# Patient Record
Sex: Female | Born: 1960 | Race: White | Hispanic: No | Smoking: Former smoker
Health system: Southern US, Community
[De-identification: ages and names within clinical notes are randomized; demographics above are authoritative.]

## PROBLEM LIST (undated history)

## (undated) DIAGNOSIS — I10 Essential (primary) hypertension: Secondary | ICD-10-CM

## (undated) DIAGNOSIS — F329 Major depressive disorder, single episode, unspecified: Secondary | ICD-10-CM

## (undated) DIAGNOSIS — J329 Chronic sinusitis, unspecified: Secondary | ICD-10-CM

## (undated) DIAGNOSIS — E785 Hyperlipidemia, unspecified: Secondary | ICD-10-CM

## (undated) DIAGNOSIS — F32A Depression, unspecified: Secondary | ICD-10-CM

## (undated) DIAGNOSIS — C801 Malignant (primary) neoplasm, unspecified: Secondary | ICD-10-CM

## (undated) DIAGNOSIS — R7303 Prediabetes: Secondary | ICD-10-CM

## (undated) DIAGNOSIS — H269 Unspecified cataract: Secondary | ICD-10-CM

## (undated) HISTORY — PX: ABDOMINAL HYSTERECTOMY: SHX81

## (undated) HISTORY — PX: EYE SURGERY: SHX253

## (undated) HISTORY — DX: Essential (primary) hypertension: I10

## (undated) HISTORY — DX: Chronic sinusitis, unspecified: J32.9

## (undated) HISTORY — DX: Hyperlipidemia, unspecified: E78.5

## (undated) HISTORY — DX: Unspecified cataract: H26.9

## (undated) HISTORY — DX: Malignant (primary) neoplasm, unspecified: C80.1

## (undated) HISTORY — DX: Prediabetes: R73.03

## (undated) HISTORY — DX: Major depressive disorder, single episode, unspecified: F32.9

## (undated) HISTORY — DX: Depression, unspecified: F32.A

---

## 2014-01-19 LAB — HM MAMMOGRAPHY

## 2015-03-08 LAB — HM MAMMOGRAPHY

## 2015-04-25 LAB — HM PAP SMEAR

## 2016-04-10 LAB — HM MAMMOGRAPHY

## 2016-08-31 LAB — HM HEPATITIS C SCREENING LAB: HM Hepatitis Screen: NEGATIVE

## 2016-08-31 LAB — TSH: TSH: 0.79 (ref 0.41–5.90)

## 2016-11-27 LAB — HM HIV SCREENING LAB: HM HIV Screening: NEGATIVE

## 2017-08-06 ENCOUNTER — Ambulatory Visit (INDEPENDENT_AMBULATORY_CARE_PROVIDER_SITE_OTHER): Payer: PRIVATE HEALTH INSURANCE | Admitting: Family Medicine

## 2017-08-06 ENCOUNTER — Encounter: Payer: Self-pay | Admitting: Family Medicine

## 2017-08-06 VITALS — BP 124/80 | HR 63 | Temp 98.4°F | Resp 16 | Ht 64.0 in | Wt 201.0 lb

## 2017-08-06 DIAGNOSIS — Z85828 Personal history of other malignant neoplasm of skin: Secondary | ICD-10-CM | POA: Diagnosis not present

## 2017-08-06 DIAGNOSIS — L309 Dermatitis, unspecified: Secondary | ICD-10-CM | POA: Diagnosis not present

## 2017-08-06 DIAGNOSIS — H9190 Unspecified hearing loss, unspecified ear: Secondary | ICD-10-CM | POA: Insufficient documentation

## 2017-08-06 DIAGNOSIS — E785 Hyperlipidemia, unspecified: Secondary | ICD-10-CM

## 2017-08-06 DIAGNOSIS — H9193 Unspecified hearing loss, bilateral: Secondary | ICD-10-CM | POA: Diagnosis not present

## 2017-08-06 DIAGNOSIS — J329 Chronic sinusitis, unspecified: Secondary | ICD-10-CM | POA: Diagnosis not present

## 2017-08-06 DIAGNOSIS — M7741 Metatarsalgia, right foot: Secondary | ICD-10-CM | POA: Diagnosis not present

## 2017-08-06 DIAGNOSIS — R7303 Prediabetes: Secondary | ICD-10-CM

## 2017-08-06 DIAGNOSIS — F32A Depression, unspecified: Secondary | ICD-10-CM | POA: Insufficient documentation

## 2017-08-06 DIAGNOSIS — F339 Major depressive disorder, recurrent, unspecified: Secondary | ICD-10-CM

## 2017-08-06 DIAGNOSIS — H259 Unspecified age-related cataract: Secondary | ICD-10-CM

## 2017-08-06 DIAGNOSIS — H269 Unspecified cataract: Secondary | ICD-10-CM | POA: Insufficient documentation

## 2017-08-06 DIAGNOSIS — I10 Essential (primary) hypertension: Secondary | ICD-10-CM

## 2017-08-06 DIAGNOSIS — F329 Major depressive disorder, single episode, unspecified: Secondary | ICD-10-CM | POA: Insufficient documentation

## 2017-08-06 MED ORDER — TRIAMCINOLONE ACETONIDE 0.5 % EX OINT: 1.0000 "application " | TOPICAL_OINTMENT | Freq: Two times a day (BID) | CUTANEOUS | 0 refills | Status: DC

## 2017-08-06 NOTE — Assessment & Plan Note (Signed)
Right foot with intermittent numbness of middle 3 toes as well as metatarsal pain Patient has lost transverse arch and does have flat feet bilaterally Discussed proper foot wear Discussed getting insoles for her shoes with metatarsal pads as well as arch support Return precautions discussed

## 2017-08-06 NOTE — Assessment & Plan Note (Signed)
Worsening problem per patient Referral to ophthalmology for further evaluation and management

## 2017-08-06 NOTE — Assessment & Plan Note (Signed)
Chronic problem that has been helped with hearing aids Hearing aids are currently broken Will refer to audiology for further evaluation and management

## 2017-08-06 NOTE — Assessment & Plan Note (Signed)
Chronic and fairly well-controlled Refilled triamcinolone ointment

## 2017-08-06 NOTE — Assessment & Plan Note (Signed)
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

## 2017-08-06 NOTE — Assessment & Plan Note (Signed)
No abnormal lesions currently per patient report Full skin exam was not completed today Referral to dermatology for monitoring, annual skin checks, any procedures that may be indicated in the future

## 2017-08-06 NOTE — Assessment & Plan Note (Signed)
Control unknown Continue Lipitor at current dose [facility] records from previous PCP Repeat lipid panel and CMP today

## 2017-08-06 NOTE — Progress Notes (Signed)
Patient: Janice Powers, Female    DOB: 1961-05-18, 57 y.o.   MRN: [id number] Visit Date: 08/06/2017  Today's Provider: Lavon Paganini, MD   I, Martha Clan, CMA, am acting as scribe for Lavon Paganini, MD.  Chief Complaint  Patient presents with  . Establish Care   Subjective:    Establish Care Janice Powers is a 57 y.o. female who presents today for health maintenance and to establish care. She feels well. She reports exercising 5 days per week for 30 minutes. Walks in the mornings. Currently not exercising, as she needs to "get back into routine" after moving to the area. She reports she is sleeping well.  Pt just moved to the area from [place], [place]. She states her colonoscopy and mammogram are UTD. She received flu vaccine in September.  Hearing aids have recently died.  She requests referral to Audiology  Recurrent sinusitis: Previously seeing ENT and would like to referral to local ENT.  No sinus surgeries.  Last infection >1 yr ago.  Cataracts: Previously seeing Optho.  States they are just starting to form and she is noticing decreased night vision.  S/p lasik eye surgery - Things seemed to revert with age  Non-melanoma skin cancer: Has had several local excisions of non-melanomas.  Was previously seeign Derm for annual skin check.  Father has also had several non-melanoma skin cancers  Previously seeing GYN for pap smears and mammograms. S/p supracervical abdominal hysterectomy.  No h/o abnormal pap smears.  HTN: Taking HCTZ 12.5 mg daily.  Reports good compliance and denies side efffects  HLD: Taking lipitor 10 mg daily.  Thinks it has been >1 yr since last lipid panel drawn.  Reports good compliance and denies side effects.  No previous CAD/CVA.  Prediabetes: Has never crossed into full-blown diabetes.  Has never taken medications.  R foot pain: Pain in middle 3 toes of R foot with tingling/numbness intermittently.  Hasn't noticed any pattern related to shoe  wear or activities that seems to worsen this.  Hasn't noticed anything that makes it better or worse.   Depression: currently well controlled on Celexa 30 mg daily. She has a therapist who helps her with coping mechanisms. 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 She is concerned that this may be very stressful and way on her mental health. She states it is not currently causing any issues. She does have good stress reduction techniques and coping techniques from her therapist. She does meditate and exercise regularly to help with her stress levels.  -----------------------------------------------------------------   Review of Systems  Constitutional: Negative.   HENT: Positive for hearing loss and sinus pressure. Negative for congestion, dental problem, drooling, ear discharge, ear pain, facial swelling, mouth sores, nosebleeds, postnasal drip, rhinorrhea, sinus pain, sneezing, sore throat, tinnitus, trouble swallowing and voice change.   Eyes: Negative.   Respiratory: Negative.   Cardiovascular: Negative.   Gastrointestinal: Negative.   Endocrine: Negative.   Genitourinary: Negative.   Musculoskeletal: Positive for back pain. Negative for arthralgias, gait problem, joint swelling, myalgias, neck pain and neck stiffness.  Skin: Negative.   Allergic/Immunologic: Negative.   Neurological: Positive for numbness. Negative for dizziness, tremors, seizures, syncope, facial asymmetry, speech difficulty, weakness, light-headedness and headaches.  Hematological: Negative.   Psychiatric/Behavioral: Negative.     Social History      She  reports that she quit smoking about 35 years ago. Her smoking use included cigarettes. She  has a 0.50 pack-year smoking history. she has never used smokeless tobacco. She reports that she drinks about 2.4 oz of alcohol per week. She reports that she does not use drugs.       Social History   Socioeconomic History  .  Marital status: Divorced    Spouse name: None  . Number of children: 0  . Years of education: 16  . Highest education level: Bachelor's degree (e.g., BA, AB, BS)  Social Needs  . Financial resource strain: Not hard at all  . Food insecurity - worry: Never true  . Food insecurity - inability: Never true  . Transportation needs - medical: No  . Transportation needs - non-medical: No  Occupational History    Employer: [employer]  Tobacco Use  . Smoking status: Former Smoker    Packs/day: 0.50    Years: 1.00    Pack years: 0.50    Types: Cigarettes    Last attempt to quit: 07/12/1982    Years since quitting: 35.0  . Smokeless tobacco: Never Used  Substance and Sexual Activity  . Alcohol use: Yes    Alcohol/week: 2.4 oz    Types: 2 Shots of liquor, 2 Glasses of wine per week  . Drug use: No  . Sexual activity: No  Other Topics Concern  . None  Social History Narrative  . None    Past Medical History:  Diagnosis Date  . Cancer ([place])    skin  . Cataract   . Depression   . Hyperlipidemia   . Hypertension   . Prediabetes   . Recurrent sinusitis      There are no active problems to display for this patient.   Past Surgical History:  Procedure Laterality Date  . ABDOMINAL HYSTERECTOMY     supracervical  . EYE SURGERY     lasik    Family History        Family Status  Relation Name Status  . Mother  Deceased  . Father  Alive  . Brother  Deceased  . Neg Hx  (Not Specified)        Her family history includes COPD in her father; Congenital heart disease in her brother; Diabetes in her father; Multiple myeloma in her mother.     Allergies  Allergen Reactions  . Neomycin Rash  . Penicillins Rash  . Sulfa Antibiotics Rash     Current Outpatient Medications:  .  atorvastatin (LIPITOR) 10 MG tablet, , Disp: , Rfl:  .  BIOTIN PO, Take by mouth., Disp: , Rfl:  .  CALCIUM PO, Take by mouth., Disp: , Rfl:  .  citalopram (CELEXA) 10 MG tablet, Take 10 mg by  mouth daily., Disp: , Rfl:  .  citalopram (CELEXA) 20 MG tablet, , Disp: , Rfl:  .  fluticasone (FLONASE) 50 MCG/ACT nasal spray, , Disp: , Rfl:  .  hydrochlorothiazide (HYDRODIURIL) 12.5 MG tablet, , Disp: , Rfl:  .  Multiple Vitamins-Minerals (CENTRUM SILVER PO), Take by mouth., Disp: , Rfl:  .  naproxen sodium (ALEVE) 220 MG tablet, Take 220 mg by mouth., Disp: , Rfl:  .  Nutritional Supplements (ESTROVEN PO), Take by mouth., Disp: , Rfl:  .  Omega-3 Fatty Acids (FISH OIL PO), Take by mouth., Disp: , Rfl:  .  Potassium 99 MG TABS, Take by mouth., Disp: , Rfl:  .  psyllium (METAMUCIL) 58.6 % powder, Take 1 packet by mouth 3 (three) times daily., Disp: , Rfl:  Patient Care Team: Virginia Crews, MD as PCP - General (Family Medicine)      Objective:   Vitals: BP 124/80 (BP Location: Left Arm, Patient Position: Sitting, Cuff Size: Large)   Pulse 63   Temp 98.4 F (36.9 C) (Oral)   Resp 16   Ht 5' 4"  (1.626 m)   Wt 201 lb (91.2 kg)   SpO2 97%   BMI 34.50 kg/m    Vitals:   08/06/17 1514  BP: 124/80  Pulse: 63  Resp: 16  Temp: 98.4 F (36.9 C)  TempSrc: Oral  SpO2: 97%  Weight: 201 lb (91.2 kg)  Height: 5' 4"  (1.626 m)     Physical Exam  Constitutional: She is oriented to person, place, and time. She appears well-developed and well-nourished. No distress.  HENT:  Head: Normocephalic and atraumatic.  Right Ear: External ear normal.  Left Ear: External ear normal.  Nose: Nose normal.  Mouth/Throat: Oropharynx is clear and moist.  Eyes: Conjunctivae and EOM are normal. Pupils are equal, round, and reactive to light. No scleral icterus.  Neck: Neck supple. No thyromegaly present.  Cardiovascular: Normal rate, regular rhythm, normal heart sounds and intact distal pulses.  No murmur heard. Pulmonary/Chest: Breath sounds normal. No respiratory distress. She has no wheezes. She has no rales.  Abdominal: Soft. Bowel sounds are normal. She exhibits no distension. There  is no tenderness. There is no rebound and no guarding.  Musculoskeletal: She exhibits no edema or tenderness.  B/l feet: sensation intact with monofilament testing. Low longitudinal arches. Wide front toe box. Slightly everted feet bilaterally. Loss of transverse arch bilaterally worse in right foot and left foot.  No deformity or tenderness to palpation. No ulcerations or wounds.  Lymphadenopathy:    She has no cervical adenopathy.  Neurological: She is alert and oriented to person, place, and time.  Skin: Skin is warm and dry. No rash noted.  Psychiatric: She has a normal mood and affect. Her behavior is normal.  Vitals reviewed.    Depression Screen PHQ 2/9 Scores 08/06/2017  PHQ - 2 Score 0     Assessment & Plan:    Problem List Items Addressed This Visit      Cardiovascular and Mediastinum   Hypertension - Primary    Currently well controlled Continue HCTZ at current dose Check CMP today Follow-up in 6 months      Relevant Medications   atorvastatin (LIPITOR) 10 MG tablet   hydrochlorothiazide (HYDRODIURIL) 12.5 MG tablet   Other Relevant Orders   Comprehensive metabolic panel     Respiratory   Recurrent sinusitis    Chronic intermittent problem No issues currently or any evidence of acute bacterial sinusitis As patient was previously followed by ENT and she desires rereferral, I will refer to ENT today for management      Relevant Medications   fluticasone (FLONASE) 50 MCG/ACT nasal spray   Other Relevant Orders   Ambulatory referral to ENT   CBC w/Diff/Platelet     Nervous and Auditory   Hearing loss    Chronic problem that has been helped with hearing aids Hearing aids are currently broken Will refer to audiology for further evaluation and management      Relevant Orders   Ambulatory referral to Audiology     Musculoskeletal and Integument   Eczema    Chronic and fairly well-controlled Refilled triamcinolone ointment        Other    Hyperlipidemia    Control unknown Continue  Lipitor at current dose [facility] records from previous PCP Repeat lipid panel and CMP today      Relevant Medications   atorvastatin (LIPITOR) 10 MG tablet   hydrochlorothiazide (HYDRODIURIL) 12.5 MG tablet   Other Relevant Orders   Lipid Profile   Comprehensive metabolic panel   Prediabetes    Last A1c unknown We'll request records from previous PCP Continue with low-carb diet and regular exercise Recheck A1c today Follow-up in 6 months      Relevant Orders   Hemoglobin A1c   Cataract    Worsening problem per patient Referral to ophthalmology for further evaluation and management      Relevant Orders   Ambulatory referral to Ophthalmology   Depression    Chronic and currently well controlled Continue Celexa 30 mg daily We will monitor closely 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 Return precautions discussed May need to consider dose titration of Celexa or addition of a second medication such as Wellbutrin in the future      Relevant Medications   citalopram (CELEXA) 20 MG tablet   Other Relevant Orders   CBC w/Diff/Platelet   History of nonmelanoma skin cancer    No abnormal lesions currently per patient report Full skin exam was not completed today Referral to dermatology for monitoring, annual skin checks, any procedures that may be indicated in the future      Relevant Orders   Ambulatory referral to Dermatology   Metatarsalgia of right foot    Right foot with intermittent numbness of middle 3 toes as well as metatarsal pain Patient has lost transverse arch and does have flat feet bilaterally Discussed proper foot wear Discussed getting insoles for her shoes with metatarsal pads as well as arch support Return precautions discussed          Return in about 6 months (around 02/03/2018).   The entirety of the information documented in the History of Present Illness, Review of Systems and  Physical Exam were personally obtained by me. Portions of this information were initially documented by Raquel Sarna Ratchford, CMA and reviewed by me for thoroughness and accuracy.    Virginia Crews, MD, MPH [facility] 08/06/2017 4:45 PM

## 2017-08-06 NOTE — Patient Instructions (Addendum)
Look into insoles with arch support and metatarsal pads   Morton Neuralgia Morton neuralgia is a type of foot pain in the area closest to your toes. This area is sometimes called the ball of your foot. Morton neuralgia occurs when a branch of a nerve in your foot (digital nerve) becomes compressed. When this happens over a long period of time, the nerve can thicken (neuroma) and cause pain. This usually occurs between the third and fourth toe. Morton neuralgia can come and go but may get worse over time. What are the causes? Your digital nerve can become compressed and stretched at a point where it passes under a thick band of tissue that connects your toes (intermetatarsal ligament). Morton neuralgia can be caused by mild repetitive damage in this area. This type of damage can result from:  Activities such as running or jumping.  Wearing shoes that are too tight.  What increases the risk? You may be at risk for Morton neuralgia if you:  Are female.  Wear high heels.  Wear shoes that are narrow or tight.  Participate in activities that stretch your toes. These include: ? Running. ? [place]. ? Long-distance walking.  What are the signs or symptoms? The first symptom of Morton neuralgia is pain that spreads from the ball of your foot to your toes. It may feel like you are walking on a marble. Pain usually gets worse with walking and goes away at night. Other symptoms may include numbness and cramping of your toes. How is this diagnosed? Your health care provider will do a physical exam. When doing the exam, your health care provider may:  Squeeze your foot just behind your toe.  Ask you to move your toes to check for pain.  You may also have tests on your foot to confirm the diagnosis. These may include:  An X-ray.  An MRI.  How is this treated? Treatment for Morton neuralgia may be as simple as changing the kind of shoes you wear. Other treatments may include:  Wearing a  supportive pad (orthosis) under the front of your foot. This lifts your toe bones and takes pressure off the nerve.  Getting injections of numbing medicine and anti-inflammatory medicine (steroid) in the nerve.  Having surgery to remove part of the thickened nerve.  Follow these instructions at home:  Take medicine only as directed by your health care provider.  Wear soft-soled shoes with a wide toe area.  Stop activities that may be causing pain.  Elevate your foot when resting.  Massage your foot.  Apply ice to the injured area: ? Put ice in a plastic bag. ? Place a towel between your skin and the bag. ? Leave the ice on for 20 minutes, 2-3 times a day.  Keep all follow-up visits as directed by your health care provider. This is important. Contact a health care provider if:  Home care instructions are not helping you get better.  Your symptoms change or get worse. This information is not intended to replace advice given to you by your health care provider. Make sure you discuss any questions you have with your health care provider. Document Released: 10/05/2000 Document Revised: 12/05/2015 Document Reviewed: 08/30/2013 Elsevier Interactive Patient Education  [employer].

## 2017-08-06 NOTE — Assessment & Plan Note (Signed)
Chronic intermittent problem No issues currently or any evidence of acute bacterial sinusitis As patient was previously followed by ENT and she desires rereferral, I will refer to ENT today for management

## 2017-08-06 NOTE — Assessment & Plan Note (Signed)
Last A1c unknown We'll request records from previous PCP Continue with low-carb diet and regular exercise Recheck A1c today Follow-up in 6 months

## 2017-08-06 NOTE — Assessment & Plan Note (Signed)
Currently well controlled Continue HCTZ at current dose Check CMP today Follow-up in 6 months

## 2017-08-07 ENCOUNTER — Encounter: Payer: Self-pay | Admitting: Family Medicine

## 2017-08-07 LAB — CBC WITH DIFFERENTIAL/PLATELET
BASOS: 0 %
Basophils Absolute: 0 10*3/uL (ref 0.0–0.2)
EOS (ABSOLUTE): 0.2 10*3/uL (ref 0.0–0.4)
Eos: 2 %
Hematocrit: 35.6 % (ref 34.0–46.6)
Hemoglobin: 11.6 g/dL (ref 11.1–15.9)
Immature Grans (Abs): 0 10*3/uL (ref 0.0–0.1)
Immature Granulocytes: 0 %
LYMPHS ABS: 3.1 10*3/uL (ref 0.7–3.1)
Lymphs: 25 %
MCH: 29.3 pg (ref 26.6–33.0)
MCHC: 32.6 g/dL (ref 31.5–35.7)
MCV: 90 fL (ref 79–97)
Monocytes Absolute: 0.7 10*3/uL (ref 0.1–0.9)
Monocytes: 6 %
NEUTROS ABS: 8.2 10*3/uL — AB (ref 1.4–7.0)
Neutrophils: 67 %
PLATELETS: 255 10*3/uL (ref 150–379)
RBC: 3.96 x10E6/uL (ref 3.77–5.28)
RDW: 13.1 % (ref 12.3–15.4)
WBC: 12.3 10*3/uL — ABNORMAL HIGH (ref 3.4–10.8)

## 2017-08-07 LAB — COMPREHENSIVE METABOLIC PANEL
A/G RATIO: 1.6 (ref 1.2–2.2)
ALK PHOS: 91 IU/L (ref 39–117)
ALT: 18 IU/L (ref 0–32)
AST: 14 IU/L (ref 0–40)
Albumin: 4.4 g/dL (ref 3.5–5.5)
BILIRUBIN TOTAL: 0.3 mg/dL (ref 0.0–1.2)
BUN / CREAT RATIO: 20 (ref 9–23)
BUN: 14 mg/dL (ref 6–24)
CO2: 26 mmol/L (ref 20–29)
Calcium: 10.2 mg/dL (ref 8.7–10.2)
Chloride: 101 mmol/L (ref 96–106)
Creatinine, Ser: 0.7 mg/dL (ref 0.57–1.00)
GFR calc Af Amer: 113 mL/min/{1.73_m2} (ref >59)
GFR calc non Af Amer: 98 mL/min/{1.73_m2} (ref >59)
GLOBULIN, TOTAL: 2.8 g/dL (ref 1.5–4.5)
Glucose: 94 mg/dL (ref 65–99)
POTASSIUM: 3.7 mmol/L (ref 3.5–5.2)
SODIUM: 141 mmol/L (ref 134–144)
Total Protein: 7.2 g/dL (ref 6.0–8.5)

## 2017-08-07 LAB — LIPID PANEL
CHOLESTEROL TOTAL: 173 mg/dL (ref 100–199)
Chol/HDL Ratio: 3.4 ratio (ref 0.0–4.4)
HDL: 51 mg/dL (ref >39)
LDL Calculated: 102 mg/dL — ABNORMAL HIGH (ref 0–99)
TRIGLYCERIDES: 98 mg/dL (ref 0–149)
VLDL CHOLESTEROL CAL: 20 mg/dL (ref 5–40)

## 2017-08-07 LAB — HEMOGLOBIN A1C
ESTIMATED AVERAGE GLUCOSE: 123 mg/dL
HEMOGLOBIN A1C: 5.9 % — AB (ref 4.8–5.6)

## 2017-08-09 ENCOUNTER — Telehealth: Payer: Self-pay

## 2017-08-09 MED ORDER — ATORVASTATIN CALCIUM 20 MG PO TABS: 20.0000 mg | ORAL_TABLET | Freq: Every day | ORAL | 1 refills | Status: DC

## 2017-08-09 NOTE — Telephone Encounter (Signed)
FYI.Marland KitchenMarland KitchenFixed her preferred pharmacy in the chart.   Thanks,   -Mickel Baas

## 2017-08-09 NOTE — Telephone Encounter (Signed)
Pt advised.  RX sent to [facility] in [place].   Thanks,   -Mickel Baas

## 2017-08-09 NOTE — Telephone Encounter (Signed)
-----   Message from Virginia Crews, MD sent at 08/09/2017  9:28 AM EST ----- Cholesterol is fair, but LDL (bad cholesterol) is just slightly above goal of <100.  Would recommend increasing Atorvastatin to 20 mg daily.  If patient agrees, can send in new Rx.  Normal kidney function, liver function, electrolytes.  A1c still in prediabetic range at 5.9.  Diet and exercise as discussed.  WBC count is slightly elevated.  There are a lot of things that could cause this, including any recent illness. We will plan to recheck at next visit.  Virginia Crews, MD, MPH [facility] 08/09/2017 9:28 AM

## 2017-09-01 ENCOUNTER — Other Ambulatory Visit: Payer: Self-pay | Admitting: Family Medicine

## 2017-09-02 ENCOUNTER — Encounter: Payer: Self-pay | Admitting: Family Medicine

## 2017-09-02 MED ORDER — TRIAMCINOLONE ACETONIDE 0.5 % EX OINT: 1.0000 "application " | TOPICAL_OINTMENT | Freq: Two times a day (BID) | CUTANEOUS | 3 refills | Status: DC

## 2017-09-06 ENCOUNTER — Encounter: Payer: Self-pay | Admitting: Family Medicine

## 2017-09-06 DIAGNOSIS — Z85828 Personal history of other malignant neoplasm of skin: Secondary | ICD-10-CM

## 2017-09-08 ENCOUNTER — Encounter: Payer: Self-pay | Admitting: Family Medicine

## 2017-09-09 MED ORDER — CITALOPRAM HYDROBROMIDE 20 MG PO TABS: 30.0000 mg | ORAL_TABLET | Freq: Every day | ORAL | 5 refills | Status: DC

## 2017-09-09 MED ORDER — HYDROCHLOROTHIAZIDE 12.5 MG PO TABS: 12.5000 mg | ORAL_TABLET | Freq: Every day | ORAL | 5 refills | Status: DC

## 2017-10-14 ENCOUNTER — Encounter: Payer: Self-pay | Admitting: Family Medicine

## 2017-10-14 MED ORDER — CITALOPRAM HYDROBROMIDE 20 MG PO TABS: 30.0000 mg | ORAL_TABLET | Freq: Every day | ORAL | 3 refills | Status: DC

## 2018-02-04 ENCOUNTER — Encounter: Payer: Self-pay | Admitting: Family Medicine

## 2018-02-04 ENCOUNTER — Ambulatory Visit (INDEPENDENT_AMBULATORY_CARE_PROVIDER_SITE_OTHER): Payer: PRIVATE HEALTH INSURANCE | Admitting: Family Medicine

## 2018-02-04 VITALS — BP 136/84 | HR 83 | Temp 98.3°F | Resp 16 | Wt 185.0 lb

## 2018-02-04 DIAGNOSIS — E7800 Pure hypercholesterolemia, unspecified: Secondary | ICD-10-CM | POA: Diagnosis not present

## 2018-02-04 DIAGNOSIS — S83411A Sprain of medial collateral ligament of right knee, initial encounter: Secondary | ICD-10-CM

## 2018-02-04 DIAGNOSIS — R7303 Prediabetes: Secondary | ICD-10-CM

## 2018-02-04 DIAGNOSIS — D72829 Elevated white blood cell count, unspecified: Secondary | ICD-10-CM | POA: Insufficient documentation

## 2018-02-04 DIAGNOSIS — I10 Essential (primary) hypertension: Secondary | ICD-10-CM

## 2018-02-04 DIAGNOSIS — E669 Obesity, unspecified: Secondary | ICD-10-CM | POA: Insufficient documentation

## 2018-02-04 DIAGNOSIS — Z6831 Body mass index (BMI) 31.0-31.9, adult: Secondary | ICD-10-CM

## 2018-02-04 LAB — POCT GLYCOSYLATED HEMOGLOBIN (HGB A1C)
ESTIMATED AVERAGE GLUCOSE: 117
Hemoglobin A1C: 5.7 % — AB (ref 4.0–5.6)

## 2018-02-04 NOTE — Assessment & Plan Note (Signed)
Well-controlled Continue HCTZ at current dose Check CMP Follow-up in 6 months

## 2018-02-04 NOTE — Assessment & Plan Note (Signed)
LDL was above goal at last check Lipitor was increased to 20 mg daily at that time Recheck CMP and lipid panel on new dose of Lipitor

## 2018-02-04 NOTE — Assessment & Plan Note (Signed)
A1c improving Continue with low-carb diet and regular exercise Follow-up in 6 months

## 2018-02-04 NOTE — Assessment & Plan Note (Signed)
Congratulated patient on her 16 pound weight loss Continue with diet program through her work that is designed to decrease her heart disease and diabetes risk

## 2018-02-04 NOTE — Assessment & Plan Note (Signed)
WBC elevated at last check Likely was reactive and not very elevated Recheck CBC today

## 2018-02-04 NOTE — Patient Instructions (Addendum)
Congratulations on the weight loss  No changes to medications  Come back to get labs one morning when fasting

## 2018-02-04 NOTE — Progress Notes (Signed)
Patient: Janice Powers Female    DOB: June 21, 1961   57 y.o.   MRN: [id number] Visit Date: 02/04/2018  Today's Provider: Lavon Paganini, MD   I, Martha Clan, CMA, am acting as scribe for Lavon Paganini, MD.  Chief Complaint  Patient presents with  . Hyperglycemia  . Hypertension  . Hyperlipidemia  . Abnormal Lab   Subjective:    HPI      Prediabetes, Follow-up:   Lab Results  Component Value Date   HGBA1C 5.7 (A) 02/04/2018   HGBA1C 5.9 (H) 08/06/2017   Last seen for diabetes 6 months ago.  Management since then includes encouraging pt on work on diet and exercise. She reports excellent compliance with treatment. She is not having side effects.  Current symptoms include none and have been stable. Weight trend: decreasing steadily through program at work that has a goal of decreasing DM and heart disease risk. Current diet: 3 proteins, 3 fruits, 3 vegetables, and 3 dairies daily. Gets carbs thorugh snacking (1/3 cup trail mix, "lunchbox snacks", granola bars, [employer] biscuits, etc Current exercise: walks to the walking path near her house about 5 days per week, which takes about 25 minutes  ------------------------------------------------------------------------   Hypertension, follow-up:  BP Readings from Last 3 Encounters:  02/04/18 136/84  08/06/17 124/80    She was last seen for hypertension 6 months ago.  BP at that visit was 124/80. Management since that visit includes continuing HCTZ.She reports good compliance with treatment. She is not having side effects.  She is adherent to low salt diet.   Outside blood pressures are improving per pt. She is experiencing none.  Patient denies chest pain, chest pressure/discomfort, claudication, dyspnea, exertional chest pressure/discomfort, fatigue, irregular heart beat, lower extremity edema, near-syncope, orthopnea, palpitations and syncope.   Cardiovascular risk factors include dyslipidemia and  hypertension.  Use of agents associated with hypertension: NSAIDS.   ------------------------------------------------------------------------    Lipid/Cholesterol, Follow-up:   Last seen for this 6 months ago.  Management since that visit includes increasing atorvastatin to 20 mg.  Last Lipid Panel:    Component Value Date/Time   CHOL 173 08/06/2017 1628   TRIG 98 08/06/2017 1628   HDL 51 08/06/2017 1628   CHOLHDL 3.4 08/06/2017 1628   LDLCALC 102 (H) 08/06/2017 1628    She reports excellent compliance with treatment. She is not having side effects.   Wt Readings from Last 3 Encounters:  02/04/18 185 lb (83.9 kg)  08/06/17 201 lb (91.2 kg)    ------------------------------------------------------------------------ Pt's last WBC count was elevated. PCP Wanted to recheck this at follow up [place].  Lab Results  Component Value Date   WBC 12.3 (H) 08/06/2017   HGB 11.6 08/06/2017   HCT 35.6 08/06/2017   MCV 90 08/06/2017   PLT 255 08/06/2017   Pt would also like to discuss a possible right medial knee strain that occurred after a fall that she had in April. She slipped on wet pavement.  Immediate pain, mild swelling.  No pop felt. Hasn't seen anyone for this problem.  She states she iced and rested the knee and use Naproxen daily prn for this for about 1.5 weeks, and the pain has resolved. She wants to make sure this is alright now.   Allergies  Allergen Reactions  . Neomycin Rash  . Penicillins Rash  . Sulfa Antibiotics Rash     Current Outpatient Medications:  .  atorvastatin (LIPITOR) 20 MG tablet, Take 1 tablet (20 mg total)  by mouth daily., Disp: 90 tablet, Rfl: 1 .  BIOTIN PO, Take by mouth., Disp: , Rfl:  .  CALCIUM PO, Take by mouth., Disp: , Rfl:  .  citalopram (CELEXA) 20 MG tablet, Take 1.5 tablets (30 mg total) by mouth daily., Disp: 135 tablet, Rfl: 3 .  fluticasone (FLONASE) 50 MCG/ACT nasal spray, , Disp: , Rfl:  .  hydrochlorothiazide (HYDRODIURIL)  12.5 MG tablet, Take 1 tablet (12.5 mg total) by mouth daily., Disp: 30 tablet, Rfl: 5 .  Multiple Vitamins-Minerals (CENTRUM SILVER PO), Take by mouth., Disp: , Rfl:  .  naproxen sodium (ALEVE) 220 MG tablet, Take 220 mg by mouth., Disp: , Rfl:  .  Nutritional Supplements (ESTROVEN PO), Take by mouth., Disp: , Rfl:  .  Omega-3 Fatty Acids (FISH OIL PO), Take by mouth., Disp: , Rfl:  .  Potassium 99 MG TABS, Take by mouth., Disp: , Rfl:  .  triamcinolone ointment (KENALOG) 0.5 %, Apply 1 application topically 2 (two) times daily., Disp: 30 g, Rfl: 3 .  psyllium (METAMUCIL) 58.6 % powder, Take 1 packet by mouth 3 (three) times daily., Disp: , Rfl:   Review of Systems  Constitutional: Negative for activity change, appetite change, chills, diaphoresis, fatigue, fever and unexpected weight change.  Respiratory: Negative for shortness of breath.   Cardiovascular: Negative for chest pain, palpitations and leg swelling.  Endocrine: Negative for polydipsia, polyphagia and polyuria.  Musculoskeletal: Positive for arthralgias.    Social History   Tobacco Use  . Smoking status: Former Smoker    Packs/day: 0.50    Years: 1.00    Pack years: 0.50    Types: Cigarettes    Last attempt to quit: 07/12/1982    Years since quitting: 35.5  . Smokeless tobacco: Never Used  Substance Use Topics  . Alcohol use: Yes    Alcohol/week: 2.4 oz    Types: 2 Shots of liquor, 2 Glasses of wine per week   Objective:   BP 136/84 (BP Location: Left Arm, Patient Position: Sitting, Cuff Size: Large)   Pulse 83   Temp 98.3 F (36.8 C) (Oral)   Resp 16   Wt 185 lb (83.9 kg)   SpO2 96%   BMI 31.76 kg/m  Vitals:   02/04/18 1044  BP: 136/84  Pulse: 83  Resp: 16  Temp: 98.3 F (36.8 C)  TempSrc: Oral  SpO2: 96%  Weight: 185 lb (83.9 kg)     Physical Exam  Constitutional: She is oriented to person, place, and time. She appears well-developed and well-nourished. No distress.  HENT:  Head: Normocephalic  and atraumatic.  Mouth/Throat: Oropharynx is clear and moist.  Eyes: Pupils are equal, round, and reactive to light. Conjunctivae are normal. Right eye exhibits no discharge. Left eye exhibits no discharge. No scleral icterus.  Neck: Neck supple. No thyromegaly present.  Cardiovascular: Normal rate, regular rhythm, normal heart sounds and intact distal pulses.  No murmur heard. Pulmonary/Chest: Effort normal and breath sounds normal. No respiratory distress. She has no wheezes. She has no rales.  Musculoskeletal: She exhibits no edema or deformity.  R Knee:  Normal to inspection with no erythema or effusion or obvious bony abnormalities.  Palpation normal with no warmth or joint line tenderness or patellar tenderness or condyle tenderness. ROM normal in flexion and extension and lower leg rotation. Ligaments with solid consistent endpoints including ACL, PCL, LCL, MCL. Negative Mcmurray's and provocative meniscal tests. Non painful patellar compression. Patellar and quadriceps tendons unremarkable. Hamstring and  quadriceps strength is normal.  Lymphadenopathy:    She has no cervical adenopathy.  Neurological: She is alert and oriented to person, place, and time.  Skin: Skin is warm and dry. Capillary refill takes less than 2 seconds. No rash noted.  Psychiatric: She has a normal mood and affect. Her behavior is normal.  Vitals reviewed.      Assessment & Plan:   Problem List Items Addressed This Visit      Cardiovascular and Mediastinum   Hypertension    Well-controlled Continue HCTZ at current dose Check CMP Follow-up in 6 months      Relevant Orders   Comprehensive metabolic panel     Other   Hyperlipidemia    LDL was above goal at last check Lipitor was increased to 20 mg daily at that time Recheck CMP and lipid panel on new dose of Lipitor      Relevant Orders   Lipid panel   Comprehensive metabolic panel   Prediabetes - Primary    A1c improving Continue with  low-carb diet and regular exercise Follow-up in 6 months      Relevant Orders   POCT glycosylated hemoglobin (Hb A1C) (Completed)   Leukocytosis    WBC elevated at last check Likely was reactive and not very elevated Recheck CBC today      Relevant Orders   CBC   Obesity    Congratulated patient on her 16 pound weight loss Continue with diet program through her work that is designed to decrease her heart disease and diabetes risk       Other Visit Diagnoses    Sprain of medial collateral ligament of right knee, initial encounter        -Normal knee exam today without any swelling, tenderness, laxity of ligaments - I believe that she did have a sprain of her MCL her fall that has now resolved -Discussed with patient that she did the right things with rest, ice, NSAIDs -No need for imaging or other work-up at this time -Return precautions discussed   Return in about 6 months (around 08/07/2018) for CPE.  We did not receive records from previous PCP after last visit, so we will send another ROI.  Patient believes that she is up-to-date on mammogram, Pap smear, hep C screening, colon cancer screening, vaccinations.   The entirety of the information documented in the History of Present Illness, Review of Systems and Physical Exam were personally obtained by me. Portions of this information were initially documented by Raquel Sarna Ratchford, CMA and reviewed by me for thoroughness and accuracy.    Virginia Crews, MD, MPH [facility] 02/04/2018 11:26 AM

## 2018-02-14 ENCOUNTER — Other Ambulatory Visit: Payer: Self-pay | Admitting: Family Medicine

## 2018-03-01 ENCOUNTER — Encounter: Payer: Self-pay | Admitting: Family Medicine

## 2018-03-05 LAB — CBC
HEMOGLOBIN: 12.3 g/dL (ref 11.1–15.9)
Hematocrit: 37.3 % (ref 34.0–46.6)
MCH: 29.8 pg (ref 26.6–33.0)
MCHC: 33 g/dL (ref 31.5–35.7)
MCV: 90 fL (ref 79–97)
Platelets: 249 10*3/uL (ref 150–450)
RBC: 4.13 x10E6/uL (ref 3.77–5.28)
RDW: 12.9 % (ref 12.3–15.4)
WBC: 8.8 10*3/uL (ref 3.4–10.8)

## 2018-03-05 LAB — COMPREHENSIVE METABOLIC PANEL
ALT: 19 IU/L (ref 0–32)
AST: 17 IU/L (ref 0–40)
Albumin/Globulin Ratio: 1.7 (ref 1.2–2.2)
Albumin: 4.6 g/dL (ref 3.5–5.5)
Alkaline Phosphatase: 90 IU/L (ref 39–117)
BUN/Creatinine Ratio: 23 (ref 9–23)
BUN: 18 mg/dL (ref 6–24)
Bilirubin Total: 0.4 mg/dL (ref 0.0–1.2)
CALCIUM: 9.7 mg/dL (ref 8.7–10.2)
CO2: 24 mmol/L (ref 20–29)
CREATININE: 0.79 mg/dL (ref 0.57–1.00)
Chloride: 102 mmol/L (ref 96–106)
GFR, EST AFRICAN AMERICAN: 97 mL/min/{1.73_m2} (ref >59)
GFR, EST NON AFRICAN AMERICAN: 84 mL/min/{1.73_m2} (ref >59)
GLOBULIN, TOTAL: 2.7 g/dL (ref 1.5–4.5)
Glucose: 86 mg/dL (ref 65–99)
Potassium: 3.8 mmol/L (ref 3.5–5.2)
Sodium: 142 mmol/L (ref 134–144)
TOTAL PROTEIN: 7.3 g/dL (ref 6.0–8.5)

## 2018-03-05 LAB — LIPID PANEL
CHOLESTEROL TOTAL: 164 mg/dL (ref 100–199)
Chol/HDL Ratio: 3.1 ratio (ref 0.0–4.4)
HDL: 53 mg/dL (ref >39)
LDL CALC: 86 mg/dL (ref 0–99)
Triglycerides: 124 mg/dL (ref 0–149)
VLDL Cholesterol Cal: 25 mg/dL (ref 5–40)

## 2018-03-15 ENCOUNTER — Other Ambulatory Visit: Payer: Self-pay | Admitting: Family Medicine

## 2018-04-02 ENCOUNTER — Ambulatory Visit (INDEPENDENT_AMBULATORY_CARE_PROVIDER_SITE_OTHER): Payer: PRIVATE HEALTH INSURANCE

## 2018-04-02 DIAGNOSIS — Z23 Encounter for immunization: Secondary | ICD-10-CM | POA: Diagnosis not present

## 2018-06-13 ENCOUNTER — Other Ambulatory Visit: Payer: Self-pay | Admitting: Family Medicine

## 2018-07-16 ENCOUNTER — Encounter: Payer: Self-pay | Admitting: Family Medicine

## 2018-07-18 MED ORDER — FLUTICASONE PROPIONATE 50 MCG/ACT NA SUSP: 2.0000 | Freq: Every day | NASAL | 3 refills | Status: DC

## 2018-08-06 ENCOUNTER — Encounter: Payer: Self-pay | Admitting: Family Medicine

## 2018-08-12 ENCOUNTER — Ambulatory Visit (INDEPENDENT_AMBULATORY_CARE_PROVIDER_SITE_OTHER): Payer: PRIVATE HEALTH INSURANCE | Admitting: Family Medicine

## 2018-08-12 ENCOUNTER — Encounter: Payer: Self-pay | Admitting: Family Medicine

## 2018-08-12 VITALS — BP 133/83 | HR 65 | Temp 98.3°F | Ht 64.0 in | Wt 186.8 lb

## 2018-08-12 DIAGNOSIS — Z1239 Encounter for other screening for malignant neoplasm of breast: Secondary | ICD-10-CM

## 2018-08-12 DIAGNOSIS — Z0000 Encounter for general adult medical examination without abnormal findings: Secondary | ICD-10-CM

## 2018-08-12 NOTE — Patient Instructions (Addendum)
Would like records from last colonoscopy, pap smear, mammogram, labs, vaccines   Preventive Care 57-64 Years, Female Preventive care refers to lifestyle choices and visits with your health care provider that can promote health and wellness. What does preventive care include?   A yearly physical exam. This is also called an annual well check.  Dental exams once or twice a year.  Routine eye exams. Ask your health care provider how often you should have your eyes checked.  Personal lifestyle choices, including: ? Daily care of your teeth and gums. ? Regular physical activity. ? Eating a healthy diet. ? Avoiding tobacco and drug use. ? Limiting alcohol use. ? Practicing safe sex. ? Taking low-dose aspirin daily starting at age 40. ? Taking vitamin and mineral supplements as recommended by your health care provider. What happens during an annual well check? The services and screenings done by your health care provider during your annual well check will depend on your age, overall health, lifestyle risk factors, and family history of disease. Counseling Your health care provider may ask you questions about your:  Alcohol use.  Tobacco use.  Drug use.  Emotional well-being.  Home and relationship well-being.  Sexual activity.  Eating habits.  Work and work Statistician.  Method of birth control.  Menstrual cycle.  Pregnancy history. Screening You may have the following tests or measurements:  Height, weight, and BMI.  Blood pressure.  Lipid and cholesterol levels. These may be checked every 5 years, or more frequently if you are over 57 years old.  Skin check.  Lung cancer screening. You may have this screening every year starting at age 57 if you have a 30-pack-year history of smoking and currently smoke or have quit within the past 15 years.  Colorectal cancer screening. All adults should have this screening starting at age 30 and continuing until age 57. Your  health care provider may recommend screening at age 25. You will have tests every 1-10 years, depending on your results and the type of screening test. People at increased risk should start screening at an earlier age. Screening tests may include: ? Guaiac-based fecal occult blood testing. ? Fecal immunochemical test (FIT). ? Stool DNA test. ? Virtual colonoscopy. ? Sigmoidoscopy. During this test, a flexible tube with a tiny camera (sigmoidoscope) is used to examine your rectum and lower colon. The sigmoidoscope is inserted through your anus into your rectum and lower colon. ? Colonoscopy. During this test, a long, thin, flexible tube with a tiny camera (colonoscope) is used to examine your entire colon and rectum.  Hepatitis C blood test.  Hepatitis B blood test.  Sexually transmitted disease (STD) testing.  Diabetes screening. This is done by checking your blood sugar (glucose) after you have not eaten for a while (fasting). You may have this done every 1-3 years.  Mammogram. This may be done every 1-2 years. Talk to your health care provider about when you should start having regular mammograms. This may depend on whether you have a family history of breast cancer.  BRCA-related cancer screening. This may be done if you have a family history of breast, ovarian, tubal, or peritoneal cancers.  Pelvic exam and Pap test. This may be done every 3 years starting at age 57. Starting at age 27, this may be done every 5 years if you have a Pap test in combination with an HPV test.  Bone density scan. This is done to screen for osteoporosis. You may have this scan if  you are at high risk for osteoporosis. Discuss your test results, treatment options, and if necessary, the need for more tests with your health care provider. Vaccines Your health care provider may recommend certain vaccines, such as:  Influenza vaccine. This is recommended every year.  Tetanus, diphtheria, and acellular pertussis  (Tdap, Td) vaccine. You may need a Td booster every 10 years.  Varicella vaccine. You may need this if you have not been vaccinated.  Zoster vaccine. You may need this after age 17.  Measles, mumps, and rubella (MMR) vaccine. You may need at least one dose of MMR if you were born in 1957 or later. You may also need a second dose.  Pneumococcal 13-valent conjugate (PCV13) vaccine. You may need this if you have certain conditions and were not previously vaccinated.  Pneumococcal polysaccharide (PPSV23) vaccine. You may need one or two doses if you smoke cigarettes or if you have certain conditions.  Meningococcal vaccine. You may need this if you have certain conditions.  Hepatitis A vaccine. You may need this if you have certain conditions or if you travel or work in places where you may be exposed to hepatitis A.  Hepatitis B vaccine. You may need this if you have certain conditions or if you travel or work in places where you may be exposed to hepatitis B.  Haemophilus influenzae type b (Hib) vaccine. You may need this if you have certain conditions. Talk to your health care provider about which screenings and vaccines you need and how often you need them. This information is not intended to replace advice given to you by your health care provider. Make sure you discuss any questions you have with your health care provider. Document Released: 07/26/2015 Document Revised: 08/19/2017 Document Reviewed: 04/30/2015 Elsevier Interactive Patient Education  2019 [employer].

## 2018-08-12 NOTE — Progress Notes (Signed)
Patient: Janice Powers, Female    DOB: [birth year], 58 y.o.   MRN: [id number] Visit Date: 08/12/2018  Today's Provider: Lavon Paganini, MD   Chief Complaint  Patient presents with  . Annual Exam   Subjective:  I, Tiburcio Pea, CMA, am acting as a scribe for Lavon Paganini, MD.    Annual physical exam Taila Basinski is a 58 y.o. female who presents today for health maintenance and complete physical. She feels fairly well. She reports exercising 5 days per week. She reports she is sleeping fairly well.  Trouble concentration Thinks that it related to stress  Sinus pressure for months No discolored mucus Tried flonase claritin helps as well and aleve Humidifier in the house -----------------------------------------------------------------   Review of Systems  Constitutional: Negative.   HENT: Positive for sinus pressure.   Eyes: Positive for itching.  Respiratory: Negative.   Cardiovascular: Negative.   Gastrointestinal: Negative.   Endocrine: Negative.   Genitourinary: Negative.   Musculoskeletal: Positive for arthralgias.  Skin: Negative.   Allergic/Immunologic: Negative.   Neurological: Negative.   Hematological: Negative.   Psychiatric/Behavioral: Positive for decreased concentration.    Social History      She  reports that she quit smoking about 36 years ago. Her smoking use included cigarettes. She has a 0.50 pack-year smoking history. She has never used smokeless tobacco. She reports current alcohol use of about 4.0 standard drinks of alcohol per week. She reports that she does not use drugs.       Social History   Socioeconomic History  . Marital status: Divorced    Spouse name: Not on file  . Number of children: 0  . Years of education: 16  . Highest education level: Bachelor's degree (e.g., BA, AB, BS)  Occupational History  . Occupation: Nurse, adult: [employer]  Social Needs  . Financial resource strain: Not hard at all  . Food  insecurity:    Worry: Never true    Inability: Never true  . Transportation needs:    Medical: No    Non-medical: No  Tobacco Use  . Smoking status: Former Smoker    Packs/day: 0.50    Years: 1.00    Pack years: 0.50    Types: Cigarettes    Last attempt to quit: 07/12/1982    Years since quitting: 36.1  . Smokeless tobacco: Never Used  Substance and Sexual Activity  . Alcohol use: Yes    Alcohol/week: 4.0 standard drinks    Types: 2 Shots of liquor, 2 Glasses of wine per week  . Drug use: No  . Sexual activity: Never  Lifestyle  . Physical activity:    Days per week: 5 days    Minutes per session: 30 min  . Stress: Not on file  Relationships  . Social connections:    Talks on phone: Not on file    Gets together: Not on file    Attends religious service: Not on file    Active member of club or organization: Not on file    Attends meetings of clubs or organizations: Not on file    Relationship status: Not on file  Other Topics Concern  . Not on file  Social History Narrative  . Not on file    Past Medical History:  Diagnosis Date  . Cancer ([place])    skin  . Cataract   . Depression   . Hyperlipidemia   . Hypertension   . Prediabetes   .  Recurrent sinusitis      Patient Active Problem List   Diagnosis Date Noted  . Leukocytosis 02/04/2018  . Obesity 02/04/2018  . Hypertension 08/06/2017  . Hyperlipidemia 08/06/2017  . Prediabetes 08/06/2017  . Cataract 08/06/2017  . History of nonmelanoma skin cancer 08/06/2017  . Hearing loss 08/06/2017  . Eczema 08/06/2017  . Metatarsalgia of right foot 08/06/2017  . Recurrent sinusitis   . Depression     Past Surgical History:  Procedure Laterality Date  . ABDOMINAL HYSTERECTOMY     supracervical  . EYE SURGERY Bilateral    lasik    Family History        Family Status  Relation Name Status  . Mother  Deceased  . Father  Alive  . Brother  Deceased  . MGM  (Not Specified)  . MGF  (Not Specified)  . PGF   (Not Specified)  . Neg Hx  (Not Specified)        Her family history includes COPD in her father; Congenital heart disease in her brother; Diabetes in her father and maternal grandmother; Multiple myeloma in her mother; Other in her father; Skin cancer in her father; Stroke in her maternal grandfather and paternal grandfather. There is no history of Breast cancer, Ovarian cancer, Cervical cancer, or Colon cancer.      Allergies  Allergen Reactions  . Neomycin Rash  . Penicillins Rash  . Sulfa Antibiotics Rash     Current Outpatient Medications:  .  atorvastatin (LIPITOR) 20 MG tablet, TAKE 1 TABLET BY MOUTH EVERY DAY, Disp: 90 tablet, Rfl: 3 .  BIOTIN PO, Take by mouth., Disp: , Rfl:  .  CALCIUM PO, Take by mouth., Disp: , Rfl:  .  citalopram (CELEXA) 20 MG tablet, Take 1.5 tablets (30 mg total) by mouth daily., Disp: 135 tablet, Rfl: 3 .  fluticasone (FLONASE) 50 MCG/ACT nasal spray, Place 2 sprays into both nostrils daily., Disp: 48 g, Rfl: 3 .  hydrochlorothiazide (HYDRODIURIL) 12.5 MG tablet, TAKE 1 TABLET BY MOUTH DAILY, Disp: 90 tablet, Rfl: 3 .  Multiple Vitamins-Minerals (CENTRUM SILVER PO), Take by mouth., Disp: , Rfl:  .  naproxen sodium (ALEVE) 220 MG tablet, Take 220 mg by mouth., Disp: , Rfl:  .  Nutritional Supplements (ESTROVEN PO), Take by mouth., Disp: , Rfl:  .  Omega-3 Fatty Acids (FISH OIL PO), Take by mouth., Disp: , Rfl:  .  Potassium 99 MG TABS, Take by mouth., Disp: , Rfl:  .  psyllium (METAMUCIL) 58.6 % powder, Take 1 packet by mouth 3 (three) times daily., Disp: , Rfl:  .  triamcinolone ointment (KENALOG) 0.5 %, Apply 1 application topically 2 (two) times daily., Disp: 30 g, Rfl: 3   Patient Care Team: Virginia Crews, MD as PCP - General (Family Medicine)      Objective:   Vitals: BP 133/83 (BP Location: Right Arm, Patient Position: Sitting, Cuff Size: Large)   Pulse 65   Temp 98.3 F (36.8 C) (Oral)   Ht _0  (1.626 m)   Wt 186 lb 12.8 oz  (84.7 kg)   SpO2 98%   BMI 32.06 kg/m    Vitals:   08/12/18 0957  BP: 133/83  Pulse: 65  Temp: 98.3 F (36.8 C)  TempSrc: Oral  SpO2: 98%  Weight: 186 lb 12.8 oz (84.7 kg)  Height: _1  (1.626 m)     Physical Exam Vitals signs reviewed.  Constitutional:      General: She is not  in acute distress.    Appearance: Normal appearance. She is well-developed. She is not diaphoretic.  HENT:     Head: Normocephalic and atraumatic.     Right Ear: Tympanic membrane, ear canal and external ear normal.     Left Ear: Tympanic membrane, ear canal and external ear normal.     Nose: Nose normal.     Mouth/Throat:     Mouth: Mucous membranes are moist.     Pharynx: Oropharynx is clear. No oropharyngeal exudate.  Eyes:     General: No scleral icterus.    Conjunctiva/sclera: Conjunctivae normal.     Pupils: Pupils are equal, round, and reactive to light.  Neck:     Musculoskeletal: Neck supple.     Thyroid: No thyromegaly.  Cardiovascular:     Rate and Rhythm: Normal rate and regular rhythm.     Pulses: Normal pulses.     Heart sounds: Normal heart sounds. No murmur.  Pulmonary:     Effort: Pulmonary effort is normal. No respiratory distress.     Breath sounds: Normal breath sounds. No wheezing or rales.  Abdominal:     General: Bowel sounds are normal. There is no distension.     Palpations: Abdomen is soft.     Tenderness: There is no abdominal tenderness. There is no guarding or rebound.  Musculoskeletal:        General: No deformity.     Right lower leg: No edema.     Left lower leg: No edema.  Lymphadenopathy:     Cervical: No cervical adenopathy.  Skin:    General: Skin is warm and dry.     Capillary Refill: Capillary refill takes less than 2 seconds.     Findings: No rash.  Neurological:     Mental Status: She is alert and oriented to person, place, and time.  Psychiatric:        Mood and Affect: Mood normal.        Behavior: Behavior normal.        Thought Content:  Thought content normal.      Depression Screen PHQ 2/9 Scores 08/12/2018 08/06/2017  PHQ - 2 Score 0 0  PHQ- 9 Score 1 -    Assessment & Plan:     Routine Health Maintenance and Physical Exam  Exercise Activities and Dietary recommendations Goals   None     Immunization History  Administered Date(s) Administered  . Influenza,inj,Quad PF,6+ Mos 03/13/2017, 04/02/2018    Health Maintenance  Topic Date Due  . Hepatitis C Screening  [birth year]  . HIV Screening  02/11/1977  . TETANUS/TDAP  02/11/1981  . PAP SMEAR-Modifier  02/12/1983  . MAMMOGRAM  02/12/2012  . COLONOSCOPY  02/12/2012  . INFLUENZA VACCINE  Completed     Discussed health benefits of physical activity, and encouraged her to engage in regular exercise appropriate for her age and condition.    Would like to hold off on labs and screenings.  She will try to find the names of doctors who have done colonoscopy and pap smear in the past so we can obtain records --------------------------------------------------------------------  Problem List Items Addressed This Visit    None    Visit Diagnoses    Encounter for annual physical exam    -  Primary   Screening for breast cancer       Relevant Orders   MM 3D SCREEN BREAST BILATERAL       Return in about 4 weeks (around 09/09/2018) for labs  and vaccines .   The entirety of the information documented in the History of Present Illness, Review of Systems and Physical Exam were personally obtained by me. Portions of this information were initially documented by Tiburcio Pea, CMA and reviewed by me for thoroughness and accuracy.    Virginia Crews, MD, MPH [facility] 08/12/2018 12:58 PM

## 2018-08-13 ENCOUNTER — Encounter: Payer: Self-pay | Admitting: Family Medicine

## 2018-09-17 ENCOUNTER — Encounter: Payer: Self-pay | Admitting: Family Medicine

## 2018-09-21 ENCOUNTER — Other Ambulatory Visit: Payer: Self-pay

## 2018-09-21 ENCOUNTER — Ambulatory Visit (INDEPENDENT_AMBULATORY_CARE_PROVIDER_SITE_OTHER): Payer: PRIVATE HEALTH INSURANCE | Admitting: Family Medicine

## 2018-09-21 ENCOUNTER — Encounter: Payer: Self-pay | Admitting: Family Medicine

## 2018-09-21 ENCOUNTER — Ambulatory Visit
Admission: RE | Admit: 2018-09-21 | Discharge: 2018-09-21 | Disposition: A | Discharge status: Home / Self Care | Payer: PRIVATE HEALTH INSURANCE | Source: Ambulatory Visit | Attending: Family Medicine | Admitting: Family Medicine

## 2018-09-21 VITALS — BP 127/81 | HR 64 | Temp 97.5°F | Wt 184.8 lb

## 2018-09-21 DIAGNOSIS — E7800 Pure hypercholesterolemia, unspecified: Secondary | ICD-10-CM

## 2018-09-21 DIAGNOSIS — J309 Allergic rhinitis, unspecified: Secondary | ICD-10-CM | POA: Insufficient documentation

## 2018-09-21 DIAGNOSIS — R7303 Prediabetes: Secondary | ICD-10-CM

## 2018-09-21 DIAGNOSIS — H04129 Dry eye syndrome of unspecified lacrimal gland: Secondary | ICD-10-CM

## 2018-09-21 DIAGNOSIS — Z6831 Body mass index (BMI) 31.0-31.9, adult: Secondary | ICD-10-CM

## 2018-09-21 DIAGNOSIS — M62838 Other muscle spasm: Secondary | ICD-10-CM

## 2018-09-21 DIAGNOSIS — L259 Unspecified contact dermatitis, unspecified cause: Secondary | ICD-10-CM

## 2018-09-21 DIAGNOSIS — I10 Essential (primary) hypertension: Secondary | ICD-10-CM | POA: Diagnosis not present

## 2018-09-21 DIAGNOSIS — Z1231 Encounter for screening mammogram for malignant neoplasm of breast: Secondary | ICD-10-CM | POA: Insufficient documentation

## 2018-09-21 DIAGNOSIS — E669 Obesity, unspecified: Secondary | ICD-10-CM | POA: Diagnosis not present

## 2018-09-21 DIAGNOSIS — J301 Allergic rhinitis due to pollen: Secondary | ICD-10-CM

## 2018-09-21 DIAGNOSIS — Z1239 Encounter for other screening for malignant neoplasm of breast: Secondary | ICD-10-CM

## 2018-09-21 DIAGNOSIS — M25551 Pain in right hip: Secondary | ICD-10-CM | POA: Insufficient documentation

## 2018-09-21 NOTE — Assessment & Plan Note (Signed)
Continue lipitor at current dose Recheck lipid panel and CMP

## 2018-09-21 NOTE — Assessment & Plan Note (Signed)
Discussed topical hydorcortisone BID

## 2018-09-21 NOTE — Assessment & Plan Note (Signed)
Discussed OTC antihistamine and flonase Can use nasal saline as well

## 2018-09-21 NOTE — Assessment & Plan Note (Signed)
Discussed diet and exercise 

## 2018-09-21 NOTE — Assessment & Plan Note (Signed)
Recheck A1c Discussed low carb diet 

## 2018-09-21 NOTE — Assessment & Plan Note (Signed)
No notable bursitis  Discussed strengthening abductors and improving posture and gait mechanics to help with this No indication for imaging at this time

## 2018-09-21 NOTE — Assessment & Plan Note (Signed)
Continue to use artificial tears Could consider pataday in the future if allergies are contributing Avoid visine or red eye drops

## 2018-09-21 NOTE — Progress Notes (Signed)
Patient: Janice Powers Female    DOB: Mar 13, 1961   58 y.o.   MRN: [id number] Visit Date: 09/21/2018  Today's Provider: Lavon Paganini, MD   Chief Complaint  Patient presents with  . Hyperlipidemia  . Hypertension  . Pre-Diabetes   Subjective:    I, Tiburcio Pea, CMA, am acting as a scribe for Lavon Paganini, MD.    HPI  Prediabetes, Follow-up:   RecentLabs       Lab Results  Component Value Date   HGBA1C 5.7 (A) 02/04/2018   HGBA1C 5.9 (H) 08/06/2017     Last seen for diabetes 8 months ago.  Management since then includes continue low carb diet and regular exercise. She reports excellent compliance with treatment. She is not having side effects.  Current symptoms include none  Weight trend: stable Current diet: DASH diet Current exercise: walking  ------------------------------------------------------------------------   Hypertension, follow-up:     BP Readings from Last 3 Encounters:  02/04/18 136/84  08/06/17 124/80    She was last seen for hypertension 8 months ago.  BP at that visit was 136/84. Management since that visit includes continuing HCTZ. She reports good compliance with treatment. She is not having side effects.  She is adherent to low salt diet.   Outside blood pressures are being checked 3 times per week. She is experiencing none.  Patient denies chest pain, chest pressure/discomfort, claudication, dyspnea, exertional chest pressure/discomfort, fatigue, irregular heart beat, lower extremity edema, near-syncope, orthopnea, palpitations and syncope.   Cardiovascular risk factors include dyslipidemia and hypertension.  Use of agents associated with hypertension: NSAIDS.   ------------------------------------------------------------------------    Lipid/Cholesterol, Follow-up:   Last seen for this 8 months ago.  Management since that visit includes continue atorvastatin  20 mg.  Last Lipid Panel: Lab Results    Component Value Date   CHOL 164 03/04/2018   HDL 53 03/04/2018   LDLCALC 86 03/04/2018   TRIG 124 03/04/2018   CHOLHDL 3.1 03/04/2018   She reports excellent compliance with treatment. She is not having side effects.   Wt Readings from Last 3 Encounters:  09/21/18 184 lb 12.8 oz (83.8 kg)  08/12/18 186 lb 12.8 oz (84.7 kg)  02/04/18 185 lb (83.9 kg)     Other concerns:  R ear canal pain for several months ? Irritation from hearing aid  Dry eye is worsening and havign to use more eye drops  Sinus headaches more frequent  Taking claritin and using Humidifier - helps  Thought that she might have Athletes foot Seeing derm Benzoyl peroxide on feet - helps - non fungal  BP cuff picked up 4 instances of irregular heartbeat in January with normal BP Nothing since then  R hip pain - lateral Worse with walking and climbing stairs Improving  Rib muscle spasm thoguht it could be related to Dehydration - increased intake Poor posture possibly contributing Tried HCTZ qod - no change in BP and no change in spasm Intermittent  Was Rx'd Valium by ED - has taken a few times - has 3 tabs left This concerns her because her mother was diagnosed with multiple myeloma after developing leg radiculopathy Her spasm seems to start around lower sternum and radiate to her back and down to lower back    Allergies  Allergen Reactions  . Neomycin Rash  . Penicillins Rash  . Sulfa Antibiotics Rash     Current Outpatient Medications:  .  atorvastatin (LIPITOR) 20 MG tablet, TAKE 1 TABLET  BY MOUTH EVERY DAY, Disp: 90 tablet, Rfl: 3 .  BIOTIN PO, Take by mouth., Disp: , Rfl:  .  CALCIUM PO, Take by mouth., Disp: , Rfl:  .  citalopram (CELEXA) 20 MG tablet, Take 1.5 tablets (30 mg total) by mouth daily., Disp: 135 tablet, Rfl: 3 .  fluticasone (FLONASE) 50 MCG/ACT nasal spray, Place 2 sprays into both nostrils daily., Disp: 48 g, Rfl: 3 .  hydrochlorothiazide (HYDRODIURIL) 12.5 MG tablet,  TAKE 1 TABLET BY MOUTH DAILY, Disp: 90 tablet, Rfl: 3 .  Multiple Vitamins-Minerals (CENTRUM SILVER PO), Take by mouth., Disp: , Rfl:  .  naproxen sodium (ALEVE) 220 MG tablet, Take 220 mg by mouth., Disp: , Rfl:  .  Nutritional Supplements (ESTROVEN PO), Take by mouth., Disp: , Rfl:  .  Omega-3 Fatty Acids (FISH OIL PO), Take by mouth., Disp: , Rfl:  .  Potassium 99 MG TABS, Take by mouth., Disp: , Rfl:  .  psyllium (METAMUCIL) 58.6 % powder, Take 1 packet by mouth 3 (three) times daily., Disp: , Rfl:  .  triamcinolone ointment (KENALOG) 0.5 %, Apply 1 application topically 2 (two) times daily., Disp: 30 g, Rfl: 3  Review of Systems  Constitutional: Negative.   Respiratory: Negative.   Cardiovascular: Negative.   Endocrine: Negative.   Musculoskeletal: Negative.     Social History   Tobacco Use  . Smoking status: Former Smoker    Packs/day: 0.50    Years: 1.00    Pack years: 0.50    Types: Cigarettes    Last attempt to quit: 07/12/1982    Years since quitting: 36.2  . Smokeless tobacco: Never Used  Substance Use Topics  . Alcohol use: Yes    Alcohol/week: 4.0 standard drinks    Types: 2 Shots of liquor, 2 Glasses of wine per week      Objective:   BP 127/81 (BP Location: Right Arm, Patient Position: Sitting, Cuff Size: Large)   Pulse 64   Temp (!) 97.5 F (36.4 C) (Oral)   Wt 184 lb 12.8 oz (83.8 kg)   SpO2 99%   BMI 31.72 kg/m  Vitals:   09/21/18 0811  BP: 127/81  Pulse: 64  Temp: (!) 97.5 F (36.4 C)  TempSrc: Oral  SpO2: 99%  Weight: 184 lb 12.8 oz (83.8 kg)     Physical Exam Vitals signs reviewed.  Constitutional:      General: She is not in acute distress.    Appearance: Normal appearance. She is well-developed. She is not diaphoretic.  HENT:     Head: Normocephalic and atraumatic.     Right Ear: Tympanic membrane and external ear normal.     Ears:     Comments: Dermatitis of R ear canal    Nose: Nose normal. No congestion.     Mouth/Throat:      Mouth: Mucous membranes are moist.     Pharynx: Oropharynx is clear. No oropharyngeal exudate.  Eyes:     General: No scleral icterus.    Conjunctiva/sclera: Conjunctivae normal.     Pupils: Pupils are equal, round, and reactive to light.  Neck:     Musculoskeletal: Neck supple.     Thyroid: No thyromegaly.  Cardiovascular:     Rate and Rhythm: Normal rate and regular rhythm.     Pulses: Normal pulses.     Heart sounds: Normal heart sounds. No murmur.  Pulmonary:     Effort: Pulmonary effort is normal. No respiratory distress.  Breath sounds: Normal breath sounds. No wheezing or rales.  Abdominal:     General: There is no distension.     Palpations: Abdomen is soft.     Tenderness: There is no abdominal tenderness.  Musculoskeletal:        General: No deformity.     Right lower leg: No edema.     Left lower leg: No edema.     Comments: Back/ribs: No TTP, no current muscle spasm, ROM intact R hip: ROM and strength intact. No TTP over bursa  Lymphadenopathy:     Cervical: No cervical adenopathy.  Skin:    General: Skin is warm and dry.     Capillary Refill: Capillary refill takes less than 2 seconds.     Findings: No rash.  Neurological:     Mental Status: She is alert and oriented to person, place, and time.  Psychiatric:        Mood and Affect: Mood normal.        Behavior: Behavior normal.        Thought Content: Thought content normal.         Assessment & Plan   Problem List Items Addressed This Visit      Cardiovascular and Mediastinum   Hypertension - Primary    Well controlled Continue current medications Recheck metabolic panel F/u in 6 months       Relevant Orders   Comprehensive metabolic panel     Respiratory   Allergic rhinitis    Discussed OTC antihistamine and flonase Can use nasal saline as well        Nervous and Auditory   Contact dermatitis of external ear    Discussed topical hydorcortisone BID        Other    Hyperlipidemia    Continue lipitor at current dose Recheck lipid panel and CMP      Relevant Orders   Lipid panel   Prediabetes    Recheck A1c Discussed low carb diet      Relevant Orders   Hemoglobin A1c   Obesity    Discussed diet and exercise      Muscle spasm    Reassured that she has no radiculopathy symptoms Ok to use valium that she has prn Discussed core strengthening and posture improvement Return precautions discussed      Right hip pain    No notable bursitis  Discussed strengthening abductors and improving posture and gait mechanics to help with this No indication for imaging at this time      Dry eye    Continue to use artificial tears Could consider pataday in the future if allergies are contributing Avoid visine or red eye drops          Return in about 6 months (around 03/24/2019) for chronic disease f/u.   Approximately 40 minutes was spent in discussion of which greater than 50% was consultation.    The entirety of the information documented in the History of Present Illness, Review of Systems and Physical Exam were personally obtained by me. Portions of this information were initially documented by Tiburcio Pea, CMA and reviewed by me for thoroughness and accuracy.    Virginia Crews, MD, MPH [facility] 09/21/2018 12:46 PM

## 2018-09-21 NOTE — Assessment & Plan Note (Signed)
Reassured that she has no radiculopathy symptoms Ok to use valium that she has prn Discussed core strengthening and posture improvement Return precautions discussed

## 2018-09-21 NOTE — Assessment & Plan Note (Signed)
Well controlled Continue current medications Recheck metabolic panel F/u in 6 months  

## 2018-09-21 NOTE — Patient Instructions (Signed)

## 2018-09-22 LAB — HEMOGLOBIN A1C
Est. average glucose Bld gHb Est-mCnc: 114 mg/dL
Hgb A1c MFr Bld: 5.6 % (ref 4.8–5.6)

## 2018-09-22 LAB — COMPREHENSIVE METABOLIC PANEL
ALBUMIN: 4.7 g/dL (ref 3.8–4.9)
ALK PHOS: 104 IU/L (ref 39–117)
ALT: 28 IU/L (ref 0–32)
AST: 17 IU/L (ref 0–40)
Albumin/Globulin Ratio: 1.8 (ref 1.2–2.2)
BUN / CREAT RATIO: 19 (ref 9–23)
BUN: 16 mg/dL (ref 6–24)
Bilirubin Total: 0.4 mg/dL (ref 0.0–1.2)
CO2: 24 mmol/L (ref 20–29)
Calcium: 10.4 mg/dL — ABNORMAL HIGH (ref 8.7–10.2)
Chloride: 100 mmol/L (ref 96–106)
Creatinine, Ser: 0.83 mg/dL (ref 0.57–1.00)
GFR calc non Af Amer: 79 mL/min/{1.73_m2} (ref >59)
GFR, EST AFRICAN AMERICAN: 91 mL/min/{1.73_m2} (ref >59)
GLOBULIN, TOTAL: 2.6 g/dL (ref 1.5–4.5)
Glucose: 86 mg/dL (ref 65–99)
Potassium: 3.9 mmol/L (ref 3.5–5.2)
SODIUM: 141 mmol/L (ref 134–144)
TOTAL PROTEIN: 7.3 g/dL (ref 6.0–8.5)

## 2018-09-22 LAB — LIPID PANEL
CHOLESTEROL TOTAL: 162 mg/dL (ref 100–199)
Chol/HDL Ratio: 2.8 ratio (ref 0.0–4.4)
HDL: 58 mg/dL (ref >39)
LDL Calculated: 85 mg/dL (ref 0–99)
Triglycerides: 97 mg/dL (ref 0–149)
VLDL CHOLESTEROL CAL: 19 mg/dL (ref 5–40)

## 2018-09-23 ENCOUNTER — Telehealth: Payer: Self-pay

## 2018-09-23 NOTE — Telephone Encounter (Signed)
Patient advised.KW 

## 2018-09-23 NOTE — Telephone Encounter (Signed)
-----   Message from Virginia Crews, MD sent at 09/23/2018  9:53 AM EDT ----- Normal labs, except calcium is slightly elevated.  Hold any calcium supplements if she is taking them.  Recheck at next visit

## 2018-12-13 ENCOUNTER — Other Ambulatory Visit: Payer: Self-pay | Admitting: Family Medicine

## 2019-01-12 LAB — HM DIABETES EYE EXAM

## 2019-03-13 ENCOUNTER — Other Ambulatory Visit: Payer: Self-pay | Admitting: Family Medicine

## 2019-03-16 ENCOUNTER — Other Ambulatory Visit: Payer: Self-pay | Admitting: Family Medicine

## 2019-03-16 NOTE — Telephone Encounter (Signed)
L.O.V. was 09/21/2018 please advise.

## 2019-03-21 ENCOUNTER — Other Ambulatory Visit: Payer: Self-pay | Admitting: Family Medicine

## 2019-03-28 ENCOUNTER — Encounter: Payer: Self-pay | Admitting: Family Medicine

## 2019-03-28 NOTE — Progress Notes (Signed)
Patient: Janice Powers Female    DOB: [birth year]   58 y.o.   MRN: [id number] Visit Date: 03/29/2019  Today's Provider: Lavon Paganini, MD   Chief Complaint  Patient presents with  . Follow-up    Prediabetes,Hypertension,Cholesterol   Subjective:    I,Joseline E. Rosas,RMA am acting as a scribe for Lavon Paganini MD.   HPI  Pre-Diabetes:  Lab Results  Component Value Date   HGBA1C 5.6 09/21/2018   HGBA1C 5.7 (A) 02/04/2018   HGBA1C 5.9 (H) 08/06/2017   Last seen for pre- diabetes 6 months ago.  Management since then includes none. Current diet: DASH diet. Current exercise: walking  ------------------------------------------------------------------------   Hypertension, follow-up:  BP Readings from Last 3 Encounters:  03/29/19 (!) 154/87  09/21/18 127/81  08/12/18 133/83    She was last seen for hypertension 6 months ago.  BP at that visit was 127/81. Management since that visit includes no changes.She reports good compliance with treatment. She is not having side effects.  She is exercising. She is adherent to low salt diet.   Outside blood pressures are 120/90 She is experiencing none.  Patient denies chest pain, chest pressure/discomfort, exertional chest pressure/discomfort, fatigue, irregular heart beat, lower extremity edema, near-syncope and palpitations.   ------------------------------------------------------------------------    Lipid/Cholesterol, Follow-up:   Last seen for this 6 months ago.  Management since that visit includes none.  Last Lipid Panel:    Component Value Date/Time   CHOL 162 09/21/2018 0856   TRIG 97 09/21/2018 0856   HDL 58 09/21/2018 0856   CHOLHDL 2.8 09/21/2018 0856   LDLCALC 85 09/21/2018 0856    She reports good compliance with treatment. She is not having side effects.   Wt Readings from Last 3 Encounters:  03/29/19 193 lb (87.5 kg)  09/21/18 184 lb 12.8 oz (83.8 kg)  08/12/18 186 lb 12.8 oz (84.7 kg)     ------------------------------------------------------------------------  She has been more agitated since start of the pandemic.  She feels as though she is doing everything that she is supposed to do.  She started back into therapy.  Forgot meds in the morning last weekend . Was able to take meds about 4-5 hours late.  She feels more agitated since that late dose.  She describes herself a nervous wreck.    Depression screen [facility] 2/9 03/29/2019 08/12/2018 08/06/2017  Decreased Interest 0 0 0  Down, Depressed, Hopeless 1 0 0  PHQ - 2 Score 1 0 0  Altered sleeping 2 0 -  Tired, decreased energy 0 0 -  Change in appetite 0 0 -  Feeling bad or failure about yourself  2 0 -  Trouble concentrating 3 1 -  Moving slowly or fidgety/restless 3 0 -  Suicidal thoughts 0 0 -  PHQ-9 Score 11 1 -  Difficult doing work/chores Somewhat difficult Not difficult at all -    GAD 7 : Generalized Anxiety Score 03/29/2019  Nervous, Anxious, on Edge 2  Control/stop worrying 0  Worry too much - different things 2  Trouble relaxing 0  Restless 0  Easily annoyed or irritable 2  Afraid - awful might happen 0  Total GAD 7 Score 6  Anxiety Difficulty Somewhat difficult      Allergies  Allergen Reactions  . Neomycin Rash  . Penicillins Rash  . Sulfa Antibiotics Rash     Current Outpatient Medications:  .  atorvastatin (LIPITOR) 20 MG tablet, TAKE 1 TABLET BY MOUTH EVERY DAY, Disp: 90  tablet, Rfl: 3 .  BIOTIN PO, Take by mouth., Disp: , Rfl:  .  citalopram (CELEXA) 20 MG tablet, TAKE 1 & 1/2 TABLETS BY MOUTH EVERY DAY., Disp: 135 tablet, Rfl: 0 .  fluticasone (FLONASE) 50 MCG/ACT nasal spray, Place 2 sprays into both nostrils daily., Disp: 48 g, Rfl: 3 .  hydrochlorothiazide (HYDRODIURIL) 12.5 MG tablet, TAKE 1 TABLET BY MOUTH DAILY, Disp: 90 tablet, Rfl: 3 .  Multiple Vitamins-Minerals (CENTRUM SILVER PO), Take by mouth., Disp: , Rfl:  .  naproxen sodium (ALEVE) 220 MG tablet, Take 220 mg by mouth.,  Disp: , Rfl:  .  Nutritional Supplements (ESTROVEN PO), Take by mouth., Disp: , Rfl:  .  Omega-3 Fatty Acids (FISH OIL PO), Take by mouth., Disp: , Rfl:  .  Potassium 99 MG TABS, Take by mouth., Disp: , Rfl:  .  psyllium (METAMUCIL) 58.6 % powder, Take 1 packet by mouth 3 (three) times daily., Disp: , Rfl:  .  triamcinolone ointment (KENALOG) 0.5 %, Apply 1 application topically 2 (two) times daily., Disp: 30 g, Rfl: 3  Review of Systems  Constitutional: Negative.   Respiratory: Negative.   Genitourinary: Negative.   Hematological: Negative.     Social History   Tobacco Use  . Smoking status: Former Smoker    Packs/day: 0.50    Years: 1.00    Pack years: 0.50    Types: Cigarettes    Quit date: 07/12/1982    Years since quitting: 36.7  . Smokeless tobacco: Never Used  Substance Use Topics  . Alcohol use: Yes    Alcohol/week: 4.0 standard drinks    Types: 2 Shots of liquor, 2 Glasses of wine per week      Objective:   BP (!) 154/87 (BP Location: Right Arm, Patient Position: Sitting, Cuff Size: Normal)   Pulse (!) 56   Temp (!) 97.1 F (36.2 C) (Other (Comment))   Resp 16   Ht 5\' 4"  (1.626 m)   Wt 193 lb (87.5 kg)   BMI 33.13 kg/m  Vitals:   03/29/19 0822  BP: (!) 154/87  Pulse: (!) 56  Resp: 16  Temp: (!) 97.1 F (36.2 C)  TempSrc: Other (Comment)  Weight: 193 lb (87.5 kg)  Height: 5\' 4"  (1.626 m)  Body mass index is 33.13 kg/m.   Physical Exam Vitals signs reviewed.  Constitutional:      General: She is not in acute distress.    Appearance: Normal appearance. She is well-developed. She is not diaphoretic.  HENT:     Head: Normocephalic and atraumatic.  Eyes:     General: No scleral icterus.    Conjunctiva/sclera: Conjunctivae normal.  Neck:     Musculoskeletal: Neck supple.     Thyroid: No thyromegaly.  Cardiovascular:     Rate and Rhythm: Normal rate and regular rhythm.     Pulses: Normal pulses.     Heart sounds: Normal heart sounds. No murmur.   Pulmonary:     Effort: Pulmonary effort is normal. No respiratory distress.     Breath sounds: Normal breath sounds. No wheezing, rhonchi or rales.  Musculoskeletal:     Right lower leg: No edema.     Left lower leg: No edema.  Lymphadenopathy:     Cervical: No cervical adenopathy.  Skin:    General: Skin is warm and dry.     Capillary Refill: Capillary refill takes less than 2 seconds.     Findings: No rash.  Neurological:  Mental Status: She is alert and oriented to person, place, and time. Mental status is at baseline.  Psychiatric:        Mood and Affect: Affect normal. Mood is anxious.        Behavior: Behavior normal.        Thought Content: Thought content does not include homicidal or suicidal ideation.      No results found for any visits on 03/29/19.     Assessment & Plan   Problem List Items Addressed This Visit      Cardiovascular and Mediastinum   Hypertension - Primary    Well controlled Continue current medications Recheck metabolic panel F/u in 6 months       Relevant Medications   atorvastatin (LIPITOR) 20 MG tablet   hydrochlorothiazide (HYDRODIURIL) 12.5 MG tablet   Other Relevant Orders   Comprehensive metabolic panel     Other   Hyperlipidemia    Continue lipitor Recheck CMP and FLP      Relevant Medications   atorvastatin (LIPITOR) 20 MG tablet   hydrochlorothiazide (HYDRODIURIL) 12.5 MG tablet   Other Relevant Orders   Lipid panel   Comprehensive metabolic panel   Prediabetes    Recheck A1c Discussed low carb diet      Relevant Orders   Hemoglobin A1c   Obesity    Discussed importance of healthy weight management Discussed diet and exercise      Relevant Orders   CBC with Differential/Platelet   GAD (generalized anxiety disorder)    Chronic Worsening Increase celexa dose to 40 mg daily  Discussed return precauitons      Relevant Medications   citalopram (CELEXA) 40 MG tablet   Other Relevant Orders   CBC with  Differential/Platelet    Other Visit Diagnoses    Need for influenza vaccination       Relevant Orders   Flu Vaccine QUAD 6+ mos PF IM (Fluarix Quad PF) (Completed)       Return in about 6 months (around 09/26/2019) for CPE.   The entirety of the information documented in the History of Present Illness, Review of Systems and Physical Exam were personally obtained by me. Portions of this information were initially documented by Lyndel Pleasure, CMA and reviewed by me for thoroughness and accuracy.    Bac[place]alupo, Dionne Bucy, MD MPH [facility]

## 2019-03-29 ENCOUNTER — Ambulatory Visit (INDEPENDENT_AMBULATORY_CARE_PROVIDER_SITE_OTHER): Payer: PRIVATE HEALTH INSURANCE | Admitting: Family Medicine

## 2019-03-29 ENCOUNTER — Encounter: Payer: Self-pay | Admitting: Family Medicine

## 2019-03-29 ENCOUNTER — Other Ambulatory Visit: Payer: Self-pay

## 2019-03-29 VITALS — BP 135/75 | HR 56 | Temp 97.1°F | Resp 16 | Ht 64.0 in | Wt 193.0 lb

## 2019-03-29 DIAGNOSIS — E7800 Pure hypercholesterolemia, unspecified: Secondary | ICD-10-CM | POA: Diagnosis not present

## 2019-03-29 DIAGNOSIS — Z6831 Body mass index (BMI) 31.0-31.9, adult: Secondary | ICD-10-CM

## 2019-03-29 DIAGNOSIS — I10 Essential (primary) hypertension: Secondary | ICD-10-CM | POA: Diagnosis not present

## 2019-03-29 DIAGNOSIS — R7303 Prediabetes: Secondary | ICD-10-CM | POA: Diagnosis not present

## 2019-03-29 DIAGNOSIS — Z23 Encounter for immunization: Secondary | ICD-10-CM | POA: Diagnosis not present

## 2019-03-29 DIAGNOSIS — E669 Obesity, unspecified: Secondary | ICD-10-CM | POA: Diagnosis not present

## 2019-03-29 DIAGNOSIS — F411 Generalized anxiety disorder: Secondary | ICD-10-CM | POA: Insufficient documentation

## 2019-03-29 MED ORDER — CITALOPRAM HYDROBROMIDE 40 MG PO TABS: 40.0000 mg | ORAL_TABLET | Freq: Every day | ORAL | 1 refills | Status: DC

## 2019-03-29 MED ORDER — HYDROCHLOROTHIAZIDE 12.5 MG PO TABS: 12.5000 mg | ORAL_TABLET | Freq: Every day | ORAL | 3 refills | Status: DC

## 2019-03-29 MED ORDER — ATORVASTATIN CALCIUM 20 MG PO TABS: 20.0000 mg | ORAL_TABLET | Freq: Every day | ORAL | 3 refills | Status: DC

## 2019-03-29 MED ORDER — FLUTICASONE PROPIONATE 50 MCG/ACT NA SUSP: 2.0000 | Freq: Every day | NASAL | 3 refills | Status: DC

## 2019-03-29 NOTE — Assessment & Plan Note (Signed)
Recheck A1c Discussed low carb diet 

## 2019-03-29 NOTE — Assessment & Plan Note (Signed)
Chronic Worsening Increase celexa dose to 40 mg daily  Discussed return precauitons

## 2019-03-29 NOTE — Assessment & Plan Note (Signed)
Well controlled Continue current medications Recheck metabolic panel F/u in 6 months  

## 2019-03-29 NOTE — Assessment & Plan Note (Signed)
Discussed importance of healthy weight management Discussed diet and exercise  

## 2019-03-29 NOTE — Assessment & Plan Note (Signed)
Continue lipitor Recheck CMP and FLP

## 2019-03-30 LAB — CBC WITH DIFFERENTIAL/PLATELET
Basophils Absolute: 0.1 10*3/uL (ref 0.0–0.2)
Basos: 1 %
EOS (ABSOLUTE): 0.1 10*3/uL (ref 0.0–0.4)
Eos: 1 %
Hematocrit: 36.3 % (ref 34.0–46.6)
Hemoglobin: 12.2 g/dL (ref 11.1–15.9)
Immature Grans (Abs): 0 10*3/uL (ref 0.0–0.1)
Immature Granulocytes: 0 %
Lymphocytes Absolute: 2.2 10*3/uL (ref 0.7–3.1)
Lymphs: 27 %
MCH: 30 pg (ref 26.6–33.0)
MCHC: 33.6 g/dL (ref 31.5–35.7)
MCV: 89 fL (ref 79–97)
Monocytes Absolute: 0.7 10*3/uL (ref 0.1–0.9)
Monocytes: 9 %
Neutrophils Absolute: 5 10*3/uL (ref 1.4–7.0)
Neutrophils: 62 %
Platelets: 226 10*3/uL (ref 150–450)
RBC: 4.06 x10E6/uL (ref 3.77–5.28)
RDW: 11.8 % (ref 11.7–15.4)
WBC: 8.1 10*3/uL (ref 3.4–10.8)

## 2019-03-30 LAB — HEMOGLOBIN A1C
Est. average glucose Bld gHb Est-mCnc: 117 mg/dL
Hgb A1c MFr Bld: 5.7 % — ABNORMAL HIGH (ref 4.8–5.6)

## 2019-03-30 LAB — COMPREHENSIVE METABOLIC PANEL
ALT: 21 IU/L (ref 0–32)
AST: 15 IU/L (ref 0–40)
Albumin/Globulin Ratio: 1.6 (ref 1.2–2.2)
Albumin: 4.2 g/dL (ref 3.8–4.9)
Alkaline Phosphatase: 90 IU/L (ref 39–117)
BUN/Creatinine Ratio: 31 — ABNORMAL HIGH (ref 9–23)
BUN: 22 mg/dL (ref 6–24)
Bilirubin Total: 0.3 mg/dL (ref 0.0–1.2)
CO2: 28 mmol/L (ref 20–29)
Calcium: 9.8 mg/dL (ref 8.7–10.2)
Chloride: 104 mmol/L (ref 96–106)
Creatinine, Ser: 0.7 mg/dL (ref 0.57–1.00)
GFR calc Af Amer: 111 mL/min/{1.73_m2} (ref >59)
GFR calc non Af Amer: 96 mL/min/{1.73_m2} (ref >59)
Globulin, Total: 2.6 g/dL (ref 1.5–4.5)
Glucose: 90 mg/dL (ref 65–99)
Potassium: 4.2 mmol/L (ref 3.5–5.2)
Sodium: 143 mmol/L (ref 134–144)
Total Protein: 6.8 g/dL (ref 6.0–8.5)

## 2019-03-30 LAB — LIPID PANEL
Chol/HDL Ratio: 2.9 ratio (ref 0.0–4.4)
Cholesterol, Total: 154 mg/dL (ref 100–199)
HDL: 54 mg/dL (ref >39)
LDL Chol Calc (NIH): 83 mg/dL (ref 0–99)
Triglycerides: 94 mg/dL (ref 0–149)
VLDL Cholesterol Cal: 17 mg/dL (ref 5–40)

## 2019-05-11 ENCOUNTER — Telehealth: Payer: PRIVATE HEALTH INSURANCE

## 2019-05-11 ENCOUNTER — Encounter (HOSPITAL_COMMUNITY): Payer: Self-pay

## 2019-07-28 ENCOUNTER — Encounter: Payer: Self-pay | Admitting: Family Medicine

## 2019-08-08 ENCOUNTER — Other Ambulatory Visit: Payer: Self-pay | Admitting: Family Medicine

## 2019-08-08 DIAGNOSIS — Z1231 Encounter for screening mammogram for malignant neoplasm of breast: Secondary | ICD-10-CM

## 2019-09-26 NOTE — Patient Instructions (Addendum)
 The CDC recommends two doses of Shingrix (the shingles vaccine) separated by 2 to 6 months for adults age 58 years and older. I recommend checking with your insurance plan regarding coverage for this vaccine.      Preventive Care 40-64 Years Old, Female Preventive care refers to visits with your health care provider and lifestyle choices that can promote health and wellness. This includes:  A yearly physical exam. This may also be called an annual well check.  Regular dental visits and eye exams.  Immunizations.  Screening for certain conditions.  Healthy lifestyle choices, such as eating a healthy diet, getting regular exercise, not using drugs or products that contain nicotine and tobacco, and limiting alcohol use. What can I expect for my preventive care visit? Physical exam Your health care provider will check your:  Height and weight. This may be used to calculate body mass index (BMI), which tells if you are at a healthy weight.  Heart rate and blood pressure.  Skin for abnormal spots. Counseling Your health care provider may ask you questions about your:  Alcohol, tobacco, and drug use.  Emotional well-being.  Home and relationship well-being.  Sexual activity.  Eating habits.  Work and work environment.  Method of birth control.  Menstrual cycle.  Pregnancy history. What immunizations do I need?  Influenza (flu) vaccine  This is recommended every year. Tetanus, diphtheria, and pertussis (Tdap) vaccine  You may need a Td booster every 10 years. Varicella (chickenpox) vaccine  You may need this if you have not been vaccinated. Zoster (shingles) vaccine  You may need this after age 60. Measles, mumps, and rubella (MMR) vaccine  You may need at least one dose of MMR if you were born in 1957 or later. You may also need a second dose. Pneumococcal conjugate (PCV13) vaccine  You may need this if you have certain conditions and were not previously  vaccinated. Pneumococcal polysaccharide (PPSV23) vaccine  You may need one or two doses if you smoke cigarettes or if you have certain conditions. Meningococcal conjugate (MenACWY) vaccine  You may need this if you have certain conditions. Hepatitis A vaccine  You may need this if you have certain conditions or if you travel or work in places where you may be exposed to hepatitis A. Hepatitis B vaccine  You may need this if you have certain conditions or if you travel or work in places where you may be exposed to hepatitis B. Haemophilus influenzae type b (Hib) vaccine  You may need this if you have certain conditions. Human papillomavirus (HPV) vaccine  If recommended by your health care provider, you may need three doses over 6 months. You may receive vaccines as individual doses or as more than one vaccine together in one shot (combination vaccines). Talk with your health care provider about the risks and benefits of combination vaccines. What tests do I need? Blood tests  Lipid and cholesterol levels. These may be checked every 5 years, or more frequently if you are over 58 years old.  Hepatitis C test.  Hepatitis B test. Screening  Lung cancer screening. You may have this screening every year starting at age 55 if you have a 30-pack-year history of smoking and currently smoke or have quit within the past 15 years.  Colorectal cancer screening. All adults should have this screening starting at age 58 and continuing until age 75. Your health care provider may recommend screening at age 45 if you are at increased risk. You   have tests every 1-10 years, depending on your results and the type of screening test.  Diabetes screening. This is done by checking your blood sugar (glucose) after you have not eaten for a while (fasting). You may have this done every 1-3 years.  Mammogram. This may be done every 1-2 years. Talk with your health care provider about when you should start  having regular mammograms. This may depend on whether you have a family history of breast cancer.  BRCA-related cancer screening. This may be done if you have a family history of breast, ovarian, tubal, or peritoneal cancers.  Pelvic exam and Pap test. This may be done every 3 years starting at age 60. Starting at age 64, this may be done every 5 years if you have a Pap test in combination with an HPV test. Other tests  Sexually transmitted disease (STD) testing.  Bone density scan. This is done to screen for osteoporosis. You may have this scan if you are at high risk for osteoporosis. Follow these instructions at home: Eating and drinking  Eat a diet that includes fresh fruits and vegetables, whole grains, lean protein, and low-fat dairy.  Take vitamin and mineral supplements as recommended by your health care provider.  Do not drink alcohol if: ? Your health care provider tells you not to drink. ? You are pregnant, may be pregnant, or are planning to become pregnant.  If you drink alcohol: ? Limit how much you have to 0-1 drink a day. ? Be aware of how much alcohol is in your drink. In the [place] one drink equals one 12 oz bottle of beer (355 mL), one 5 oz glass of wine (148 mL), or one 1 oz glass of hard liquor (44 mL). Lifestyle  Take daily care of your teeth and gums.  Stay active. Exercise for at least 30 minutes on 5 or more days each week.  Do not use any products that contain nicotine or tobacco, such as cigarettes, e-cigarettes, and chewing tobacco. If you need help quitting, ask your health care provider.  If you are sexually active, practice safe sex. Use a condom or other form of birth control (contraception) in order to prevent pregnancy and STIs (sexually transmitted infections).  If told by your health care provider, take low-dose aspirin daily starting at age 27. What's next?  Visit your health care provider once a year for a well check visit.  Ask your health  care provider how often you should have your eyes and teeth checked.  Stay up to date on all vaccines. This information is not intended to replace advice given to you by your health care provider. Make sure you discuss any questions you have with your health care provider. Document Revised: 03/10/2018 Document Reviewed: 03/10/2018 Elsevier Patient Education  2020 [employer].

## 2019-09-26 NOTE — Progress Notes (Signed)
Patient: Janice Powers, Female    DOB: [birth year], 59 y.o.   MRN: [id number] Visit Date: 09/27/2019  Today's Provider: Lavon Paganini, MD   Chief Complaint  Patient presents with  . Annual Exam   Subjective:     Annual physical exam Kamie Korber is a 59 y.o. female who presents today for health maintenance and complete physical. She feels well. She reports exercising yes. She reports she is sleeping well. 04/25/2015 Pap/HPV-negative 09/21/2018 Mammogram-BI-RADS 1 12/29/2016 Colonoscopy-perianal and digital rectal examination-normal         Cecum-one 3 mm polyp-Tubular Adenoma         Sigmoid colon-one 2 mm polyp-Tubular Adenoma -----------------------------------------------------------------   Review of Systems  Constitutional: Negative.   HENT: Negative.   Eyes: Negative.   Respiratory: Negative.   Cardiovascular: Negative.   Gastrointestinal: Negative.   Endocrine: Negative.   Genitourinary: Negative.   Musculoskeletal: Negative.   Skin: Negative.   Allergic/Immunologic: Negative.   Neurological: Negative.   Hematological: Negative.   Psychiatric/Behavioral: The patient is nervous/anxious.     Social History      She  reports that she quit smoking about 37 years ago. Her smoking use included cigarettes. She has a 0.50 pack-year smoking history. She has never used smokeless tobacco. She reports current alcohol use of about 4.0 standard drinks of alcohol per week. She reports that she does not use drugs.       Social History   Socioeconomic History  . Marital status: Divorced    Spouse name: Not on file  . Number of children: 0  . Years of education: 16  . Highest education level: Bachelor's degree (e.g., BA, AB, BS)  Occupational History  . Occupation: Nurse, adult: [employer]  Tobacco Use  . Smoking status: Former Smoker    Packs/day: 0.50    Years: 1.00    Pack years: 0.50    Types: Cigarettes    Quit date: 07/12/1982    Years since  quitting: 37.2  . Smokeless tobacco: Never Used  Substance and Sexual Activity  . Alcohol use: Yes    Alcohol/week: 4.0 standard drinks    Types: 2 Shots of liquor, 2 Glasses of wine per week  . Drug use: No  . Sexual activity: Never  Other Topics Concern  . Not on file  Social History Narrative  . Not on file   Social Determinants of Health   Financial Resource Strain:   . Difficulty of Paying Living Expenses:   Food Insecurity:   . Worried About Charity fundraiser in the Last Year:   . Arboriculturist in the Last Year:   Transportation Needs:   . Film/video editor (Medical):   Marland Kitchen Lack of Transportation (Non-Medical):   Physical Activity:   . Days of Exercise per Week:   . Minutes of Exercise per Session:   Stress:   . Feeling of Stress :   Social Connections:   . Frequency of Communication with Friends and Family:   . Frequency of Social Gatherings with Friends and Family:   . Attends Religious Services:   . Active Member of Clubs or Organizations:   . Attends Archivist Meetings:   Marland Kitchen Marital Status:     Past Medical History:  Diagnosis Date  . Cancer ([place])    skin  . Cataract   . Depression   . Hyperlipidemia   . Hypertension   . Prediabetes   .  Recurrent sinusitis      Patient Active Problem List   Diagnosis Date Noted  . GAD (generalized anxiety disorder) 03/29/2019  . Muscle spasm 09/21/2018  . Right hip pain 09/21/2018  . Contact dermatitis of external ear 09/21/2018  . Allergic rhinitis 09/21/2018  . Dry eye 09/21/2018  . Obesity 02/04/2018  . Hypertension 08/06/2017  . Hyperlipidemia 08/06/2017  . Prediabetes 08/06/2017  . Cataract 08/06/2017  . History of nonmelanoma skin cancer 08/06/2017  . Hearing loss 08/06/2017  . Eczema 08/06/2017  . Metatarsalgia of right foot 08/06/2017  . Recurrent sinusitis   . Depression     Past Surgical History:  Procedure Laterality Date  . ABDOMINAL HYSTERECTOMY     supracervical  . EYE  SURGERY Bilateral    lasik    Family History        Family Status  Relation Name Status  . Mother  Deceased  . Father  Alive  . Brother  Deceased  . MGM  (Not Specified)  . MGF  (Not Specified)  . PGF  (Not Specified)  . Neg Hx  (Not Specified)        Her family history includes COPD in her father; Congenital heart disease in her brother; Diabetes in her father and maternal grandmother; Multiple myeloma in her mother; Other in her father; Skin cancer in her father; Stroke in her maternal grandfather and paternal grandfather. There is no history of Breast cancer, Ovarian cancer, Cervical cancer, or Colon cancer.      Allergies  Allergen Reactions  . Neomycin Rash  . Penicillins Rash  . Sulfa Antibiotics Rash     Current Outpatient Medications:  .  atorvastatin (LIPITOR) 20 MG tablet, Take 1 tablet (20 mg total) by mouth daily., Disp: 90 tablet, Rfl: 3 .  BIOTIN PO, Take by mouth., Disp: , Rfl:  .  citalopram (CELEXA) 40 MG tablet, Take 1 tablet (40 mg total) by mouth daily., Disp: 90 tablet, Rfl: 1 .  fluticasone (FLONASE) 50 MCG/ACT nasal spray, Place 2 sprays into both nostrils daily., Disp: 48 g, Rfl: 3 .  hydrochlorothiazide (HYDRODIURIL) 12.5 MG tablet, Take 1 tablet (12.5 mg total) by mouth daily., Disp: 90 tablet, Rfl: 3 .  Multiple Vitamins-Minerals (CENTRUM SILVER PO), Take by mouth., Disp: , Rfl:  .  naproxen sodium (ALEVE) 220 MG tablet, Take 220 mg by mouth daily as needed. , Disp: , Rfl:  .  Omega-3 Fatty Acids (FISH OIL PO), Take by mouth., Disp: , Rfl:  .  Potassium 99 MG TABS, Take by mouth daily. , Disp: , Rfl:  .  psyllium (METAMUCIL) 58.6 % powder, Take 1 packet by mouth 3 (three) times daily., Disp: , Rfl:  .  triamcinolone ointment (KENALOG) 0.5 %, Apply 1 application topically 2 (two) times daily., Disp: 30 g, Rfl: 3   Patient Care Team: Virginia Crews, MD as PCP - General (Family Medicine)    Objective:    Vitals: BP 134/85 (BP Location: Left  Arm, Patient Position: Sitting, Cuff Size: Large)   Pulse 64   Temp (!) 97.3 F (36.3 C) (Temporal)   Resp 16   Ht 5' 4"  (1.626 m)   Wt 188 lb 6.4 oz (85.5 kg)   BMI 32.34 kg/m    Vitals:   09/27/19 0846  BP: 134/85  Pulse: 64  Resp: 16  Temp: (!) 97.3 F (36.3 C)  TempSrc: Temporal  Weight: 188 lb 6.4 oz (85.5 kg)  Height: 5' 4"  (1.626 m)  Physical Exam Vitals reviewed.  Constitutional:      General: She is not in acute distress.    Appearance: Normal appearance. She is well-developed. She is not diaphoretic.  HENT:     Head: Normocephalic and atraumatic.     Right Ear: Tympanic membrane, ear canal and external ear normal.     Left Ear: Tympanic membrane, ear canal and external ear normal.     Nose: Nose normal.     Mouth/Throat:     Mouth: Mucous membranes are moist.     Pharynx: Oropharynx is clear. No oropharyngeal exudate.  Eyes:     General: No scleral icterus.    Conjunctiva/sclera: Conjunctivae normal.     Pupils: Pupils are equal, round, and reactive to light.  Neck:     Thyroid: No thyromegaly.  Cardiovascular:     Rate and Rhythm: Normal rate and regular rhythm.     Pulses: Normal pulses.     Heart sounds: Normal heart sounds. No murmur.  Pulmonary:     Effort: Pulmonary effort is normal. No respiratory distress.     Breath sounds: Normal breath sounds. No wheezing or rales.  Abdominal:     General: Bowel sounds are normal. There is no distension.     Palpations: Abdomen is soft.     Tenderness: There is no abdominal tenderness. There is no guarding or rebound.  Genitourinary:    Comments: GYN:  External genitalia within normal limits.  Vaginal mucosa atrophic.  Nonfriable cervix without lesions, no discharge or bleeding noted on speculum exam.    Musculoskeletal:        General: No deformity.     Cervical back: Neck supple.     Right lower leg: No edema.     Left lower leg: No edema.  Lymphadenopathy:     Cervical: No cervical adenopathy.    Skin:    General: Skin is warm and dry.     Findings: No rash.  Neurological:     Mental Status: She is alert and oriented to person, place, and time. Mental status is at baseline.  Psychiatric:        Mood and Affect: Mood normal.        Behavior: Behavior normal.        Thought Content: Thought content normal.      Depression Screen PHQ 2/9 Scores 09/27/2019 03/29/2019 08/12/2018 08/06/2017  PHQ - 2 Score 0 1 0 0  PHQ- 9 Score 2 11 1  -       Assessment & Plan:     Routine Health Maintenance and Physical Exam  Exercise Activities and Dietary recommendations Goals   None     Immunization History  Administered Date(s) Administered  . Influenza,inj,Quad PF,6+ Mos 03/13/2017, 04/02/2018, 03/29/2019  . Tdap 09/01/2014  . Zoster 10/29/2011    Health Maintenance  Topic Date Due  . PAP SMEAR-Modifier  04/24/2020  . MAMMOGRAM  09/20/2020  . TETANUS/TDAP  09/01/2024  . COLONOSCOPY  12/30/2026  . INFLUENZA VACCINE  Completed  . Hepatitis C Screening  Completed  . HIV Screening  Completed     Discussed health benefits of physical activity, and encouraged her to engage in regular exercise appropriate for her age and condition.    Advised Shingrix at a later date as COVID vaccine is upcoming soon -------------------------------------------------------------------- Problem List Items Addressed This Visit      Cardiovascular and Mediastinum   Hypertension    Well controlled Continue current medications Recheck metabolic panel F/u  in 6 months      Relevant Orders   Comprehensive metabolic panel   Hemoglobin A1c     Other   Hyperlipidemia    Well-controlled on last lipid panel, which was reviewed Continue statin      Prediabetes    Mild Encourage low-carb diet Recheck A1c      Relevant Orders   Comprehensive metabolic panel   Hemoglobin A1c   Obesity    Discussed importance of healthy weight management Discussed diet and exercise       Relevant  Orders   Comprehensive metabolic panel   Hemoglobin A1c   Episode of recurrent major depressive disorder (HCC)    Chronic and well controlled Continue celexa at current dose       Other Visit Diagnoses    Encounter for annual physical exam    -  Primary   Relevant Orders   Comprehensive metabolic panel   Hemoglobin A1c   Screening for cervical cancer       Cervical cancer screening       Relevant Orders   Cytology - HPV w/PAP any interpr. 16/18 genotyping if + HPV (reflex) ([facility])       Return in about 6 months (around 03/29/2020) for chronic disease f/u.   The entirety of the information documented in the History of Present Illness, Review of Systems and Physical Exam were personally obtained by me. Portions of this information were initially documented by Lynford Humphrey, CMA and reviewed by me for thoroughness and accuracy.    Leonette Tischer, Dionne Bucy, MD MPH [facility]

## 2019-09-27 ENCOUNTER — Ambulatory Visit (INDEPENDENT_AMBULATORY_CARE_PROVIDER_SITE_OTHER): Payer: PRIVATE HEALTH INSURANCE | Admitting: Family Medicine

## 2019-09-27 ENCOUNTER — Other Ambulatory Visit: Payer: Self-pay

## 2019-09-27 ENCOUNTER — Other Ambulatory Visit (HOSPITAL_COMMUNITY)
Admission: RE | Admit: 2019-09-27 | Discharge: 2019-09-27 | Disposition: A | Discharge status: Home / Self Care | Payer: PRIVATE HEALTH INSURANCE | Source: Ambulatory Visit | Attending: Family Medicine | Admitting: Family Medicine

## 2019-09-27 ENCOUNTER — Encounter: Payer: Self-pay | Admitting: Family Medicine

## 2019-09-27 VITALS — BP 134/85 | HR 64 | Temp 97.3°F | Resp 16 | Ht 64.0 in | Wt 188.4 lb

## 2019-09-27 DIAGNOSIS — Z124 Encounter for screening for malignant neoplasm of cervix: Secondary | ICD-10-CM | POA: Diagnosis present

## 2019-09-27 DIAGNOSIS — F339 Major depressive disorder, recurrent, unspecified: Secondary | ICD-10-CM

## 2019-09-27 DIAGNOSIS — I10 Essential (primary) hypertension: Secondary | ICD-10-CM | POA: Diagnosis not present

## 2019-09-27 DIAGNOSIS — R7303 Prediabetes: Secondary | ICD-10-CM

## 2019-09-27 DIAGNOSIS — E7800 Pure hypercholesterolemia, unspecified: Secondary | ICD-10-CM | POA: Diagnosis not present

## 2019-09-27 DIAGNOSIS — Z0000 Encounter for general adult medical examination without abnormal findings: Secondary | ICD-10-CM

## 2019-09-27 DIAGNOSIS — E669 Obesity, unspecified: Secondary | ICD-10-CM

## 2019-09-27 DIAGNOSIS — Z6832 Body mass index (BMI) 32.0-32.9, adult: Secondary | ICD-10-CM

## 2019-09-27 NOTE — Assessment & Plan Note (Signed)
Discussed importance of healthy weight management Discussed diet and exercise  

## 2019-09-27 NOTE — Assessment & Plan Note (Signed)
Well controlled Continue current medications Recheck metabolic panel F/u in 6 months  

## 2019-09-27 NOTE — Assessment & Plan Note (Signed)
Mild Encourage low-carb diet Recheck A1c

## 2019-09-27 NOTE — Assessment & Plan Note (Signed)
Chronic and well controlled Continue celexa at current dose

## 2019-09-27 NOTE — Assessment & Plan Note (Signed)
Well-controlled on last lipid panel, which was reviewed Continue statin

## 2019-09-28 ENCOUNTER — Encounter: Payer: Self-pay | Admitting: Family Medicine

## 2019-09-28 ENCOUNTER — Telehealth: Payer: Self-pay

## 2019-09-28 LAB — HEMOGLOBIN A1C
Est. average glucose Bld gHb Est-mCnc: 114 mg/dL
Hgb A1c MFr Bld: 5.6 % (ref 4.8–5.6)

## 2019-09-28 LAB — COMPREHENSIVE METABOLIC PANEL
ALT: 19 IU/L (ref 0–32)
AST: 17 IU/L (ref 0–40)
Albumin/Globulin Ratio: 1.8 (ref 1.2–2.2)
Albumin: 4.7 g/dL (ref 3.8–4.9)
Alkaline Phosphatase: 103 IU/L (ref 39–117)
BUN/Creatinine Ratio: 20 (ref 9–23)
BUN: 14 mg/dL (ref 6–24)
Bilirubin Total: 0.4 mg/dL (ref 0.0–1.2)
CO2: 24 mmol/L (ref 20–29)
Calcium: 9.9 mg/dL (ref 8.7–10.2)
Chloride: 100 mmol/L (ref 96–106)
Creatinine, Ser: 0.71 mg/dL (ref 0.57–1.00)
GFR calc Af Amer: 109 mL/min/{1.73_m2} (ref >59)
GFR calc non Af Amer: 95 mL/min/{1.73_m2} (ref >59)
Globulin, Total: 2.6 g/dL (ref 1.5–4.5)
Glucose: 82 mg/dL (ref 65–99)
Potassium: 3.6 mmol/L (ref 3.5–5.2)
Sodium: 142 mmol/L (ref 134–144)
Total Protein: 7.3 g/dL (ref 6.0–8.5)

## 2019-09-28 NOTE — Telephone Encounter (Signed)
-----   Message from Virginia Crews, MD sent at 09/28/2019  8:16 AM EDT ----- Normal labs

## 2019-09-28 NOTE — Telephone Encounter (Signed)
LMTCB 09/28/2019.  PEC please advise pt of labs below.   Thanks,   -Mickel Baas

## 2019-09-28 NOTE — Telephone Encounter (Signed)
Patient aware of results per MyChart message on 09/28/19

## 2019-09-29 LAB — CYTOLOGY - PAP
Comment: NEGATIVE
Diagnosis: NEGATIVE
High risk HPV: NEGATIVE

## 2019-10-02 ENCOUNTER — Telehealth: Payer: Self-pay

## 2019-10-02 NOTE — Telephone Encounter (Signed)
   Comments to Patient Seen by patient Kenysha Laverdure on 09/30/2019 5:48 AM EDT

## 2019-10-02 NOTE — Telephone Encounter (Signed)
-----   Message from Virginia Crews, MD sent at 09/29/2019  3:50 PM EDT ----- Normal pap and HPV negative. Repeat in 5 years

## 2019-10-04 ENCOUNTER — Ambulatory Visit
Admission: RE | Admit: 2019-10-04 | Discharge: 2019-10-04 | Disposition: A | Discharge status: Home / Self Care | Payer: PRIVATE HEALTH INSURANCE | Source: Ambulatory Visit | Attending: Family Medicine | Admitting: Family Medicine

## 2019-10-04 DIAGNOSIS — Z1231 Encounter for screening mammogram for malignant neoplasm of breast: Secondary | ICD-10-CM | POA: Insufficient documentation

## 2019-10-05 ENCOUNTER — Telehealth: Payer: Self-pay

## 2019-10-05 NOTE — Telephone Encounter (Signed)
-----   Message from Virginia Crews, MD sent at 10/05/2019  9:19 AM EDT ----- Normal mammogram. Repeat in 1 yr

## 2019-10-05 NOTE — Telephone Encounter (Signed)
Left message advising pt.  (Per DPR)  Thanks,   -Suzi Hernan  

## 2019-10-29 ENCOUNTER — Encounter: Payer: Self-pay | Admitting: Family Medicine

## 2019-10-30 ENCOUNTER — Other Ambulatory Visit: Payer: Self-pay | Admitting: Family Medicine

## 2020-01-31 ENCOUNTER — Other Ambulatory Visit: Payer: Self-pay | Admitting: Family Medicine

## 2020-02-03 ENCOUNTER — Encounter: Payer: Self-pay | Admitting: Family Medicine

## 2020-03-29 ENCOUNTER — Ambulatory Visit (INDEPENDENT_AMBULATORY_CARE_PROVIDER_SITE_OTHER): Payer: PRIVATE HEALTH INSURANCE | Admitting: Family Medicine

## 2020-03-29 ENCOUNTER — Encounter: Payer: Self-pay | Admitting: Family Medicine

## 2020-03-29 ENCOUNTER — Other Ambulatory Visit: Payer: Self-pay

## 2020-03-29 VITALS — BP 142/73 | HR 65 | Temp 98.2°F | Wt 190.0 lb

## 2020-03-29 DIAGNOSIS — R7303 Prediabetes: Secondary | ICD-10-CM | POA: Diagnosis not present

## 2020-03-29 DIAGNOSIS — F339 Major depressive disorder, recurrent, unspecified: Secondary | ICD-10-CM

## 2020-03-29 DIAGNOSIS — E7800 Pure hypercholesterolemia, unspecified: Secondary | ICD-10-CM | POA: Diagnosis not present

## 2020-03-29 DIAGNOSIS — I10 Essential (primary) hypertension: Secondary | ICD-10-CM

## 2020-03-29 DIAGNOSIS — F411 Generalized anxiety disorder: Secondary | ICD-10-CM

## 2020-03-29 NOTE — Patient Instructions (Signed)

## 2020-03-29 NOTE — Assessment & Plan Note (Signed)
Chronic and well controlled.  Continue current medications.  Recheck in 6 months.

## 2020-03-29 NOTE — Assessment & Plan Note (Signed)
Chronic and well controlled.  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  She is also part of a support group that is helping with her symptoms.  Recheck in 6 months.

## 2020-03-29 NOTE — Assessment & Plan Note (Signed)
Previously well controlled Continue statin Repeat FLP and CMP Goal LDL < 100 

## 2020-03-29 NOTE — Assessment & Plan Note (Signed)
Well controlled Continue current medications Recheck metabolic panel F/u in 6 months at her physical

## 2020-03-29 NOTE — Progress Notes (Signed)
Established patient visit   Patient: Janice Powers   DOB: 1961/04/17   59 y.o. Female  MRN: [id number] Visit Date: 03/29/2020  Today's healthcare provider: Lavon Paganini, MD   No chief complaint on file.  Subjective    HPI   Prediabetes, Follow-up  Lab Results  Component Value Date   HGBA1C 5.6 09/27/2019   HGBA1C 5.7 (H) 03/29/2019   HGBA1C 5.6 09/21/2018   GLUCOSE 82 09/27/2019   GLUCOSE 90 03/29/2019   GLUCOSE 86 09/21/2018    Last seen for for this6 months ago.  Management since that visit includes no changes. Current symptoms include none and have been stable.  Prior visit with dietician: no Current diet: in general, a "healthy" diet   Current exercise: exercies regularly  Pertinent Labs:    Component Value Date/Time   CHOL 154 03/29/2019 0921   TRIG 94 03/29/2019 0921   CHOLHDL 2.9 03/29/2019 0921   CREATININE 0.71 09/27/2019 0935    Wt Readings from Last 3 Encounters:  03/29/20 190 lb (86.2 kg)  09/27/19 188 lb 6.4 oz (85.5 kg)  03/29/19 193 lb (87.5 kg)    ----------------------------------------------------------------------------------------- Lipid/Cholesterol, Follow-up  Last lipid panel Other pertinent labs  Lab Results  Component Value Date   CHOL 154 03/29/2019   HDL 54 03/29/2019   LDLCALC 83 03/29/2019   TRIG 94 03/29/2019   CHOLHDL 2.9 03/29/2019   Lab Results  Component Value Date   ALT 19 09/27/2019   AST 17 09/27/2019   PLT 226 03/29/2019   TSH 0.79 08/31/2016     She was last seen for this 6 months ago.  Management since that visit includes no changes.  She reports excellent compliance with treatment. She is not having side effects.   Symptoms: No chest pain No chest pressure/discomfort  No dyspnea No lower extremity edema  No numbness or tingling of extremity No orthopnea  No palpitations No paroxysmal nocturnal dyspnea  No speech difficulty No syncope     The 10-year ASCVD risk score Mikey Bussing DC Jr., et al.,  2013) is: 3.6%  --------------------------------------------------------------------------------------------------- Hypertension, follow-up  BP Readings from Last 3 Encounters:  03/29/20 (!) 142/73  09/27/19 134/85  03/29/19 135/75   Wt Readings from Last 3 Encounters:  03/29/20 190 lb (86.2 kg)  09/27/19 188 lb 6.4 oz (85.5 kg)  03/29/19 193 lb (87.5 kg)     She was last seen for hypertension 6 months ago.  Management since that visit includes no changes.  She reports excellent compliance with treatment. She is not having side effects.  She is following a Low Sodium diet. She is exercising. She does not smoke.  Use of agents associated with hypertension: none.   Outside blood pressures are normal at home. Symptoms: No chest pain No chest pressure  No palpitations No syncope  No dyspnea No orthopnea  No paroxysmal nocturnal dyspnea No lower extremity edema   Pertinent labs: Lab Results  Component Value Date   CHOL 154 03/29/2019   HDL 54 03/29/2019   LDLCALC 83 03/29/2019   TRIG 94 03/29/2019   CHOLHDL 2.9 03/29/2019   Lab Results  Component Value Date   NA 142 09/27/2019   K 3.6 09/27/2019   CREATININE 0.71 09/27/2019   GFRNONAA 95 09/27/2019   GFRAA 109 09/27/2019   GLUCOSE 82 09/27/2019     The 10-year ASCVD risk score Mikey Bussing DC Jr., et al., 2013) is: 3.6%   ---------------------------------------------------------------------------------------------------   Patient Active Problem List  Diagnosis Date Noted  . Episode of recurrent major depressive disorder ([place]) 09/27/2019  . GAD (generalized anxiety disorder) 03/29/2019  . Muscle spasm 09/21/2018  . Right hip pain 09/21/2018  . Contact dermatitis of external ear 09/21/2018  . Allergic rhinitis 09/21/2018  . Dry eye 09/21/2018  . Obesity 02/04/2018  . Hypertension 08/06/2017  . Hyperlipidemia 08/06/2017  . Prediabetes 08/06/2017  . Cataract 08/06/2017  . History of nonmelanoma skin cancer  08/06/2017  . Hearing loss 08/06/2017  . Eczema 08/06/2017  . Metatarsalgia of right foot 08/06/2017  . Recurrent sinusitis   . Depression    Past Medical History:  Diagnosis Date  . Cancer ([place])    skin  . Cataract   . Depression   . Hyperlipidemia   . Hypertension   . Prediabetes   . Recurrent sinusitis    Social History   Tobacco Use  . Smoking status: Former Smoker    Packs/day: 0.50    Years: 1.00    Pack years: 0.50    Types: Cigarettes    Quit date: 07/12/1982    Years since quitting: 37.7  . Smokeless tobacco: Never Used  Vaping Use  . Vaping Use: Never used  Substance Use Topics  . Alcohol use: Yes    Alcohol/week: 4.0 standard drinks    Types: 2 Shots of liquor, 2 Glasses of wine per week  . Drug use: No   Allergies  Allergen Reactions  . Neomycin Rash  . Penicillins Rash  . Sulfa Antibiotics Rash     Medications: Outpatient Medications Prior to Visit  Medication Sig  . atorvastatin (LIPITOR) 20 MG tablet Take 1 tablet (20 mg total) by mouth daily.  Marland Kitchen BIOTIN PO Take by mouth.  . citalopram (CELEXA) 40 MG tablet TAKE 1 TABLET BY MOUTH DAILY  . fluticasone (FLONASE) 50 MCG/ACT nasal spray Place 2 sprays into both nostrils daily.  . hydrochlorothiazide (HYDRODIURIL) 12.5 MG tablet Take 1 tablet (12.5 mg total) by mouth daily.  . Multiple Vitamins-Minerals (CENTRUM SILVER PO) Take by mouth.  . naproxen sodium (ALEVE) 220 MG tablet Take 220 mg by mouth daily as needed.   . Omega-3 Fatty Acids (FISH OIL PO) Take by mouth.  . Potassium 99 MG TABS Take by mouth daily.   . psyllium (METAMUCIL) 58.6 % powder Take 1 packet by mouth 3 (three) times daily.  Marland Kitchen triamcinolone ointment (KENALOG) 0.5 % Apply 1 application topically 2 (two) times daily.   No facility-administered medications prior to visit.    Review of Systems  Constitutional: Negative.   Respiratory: Negative.   Cardiovascular: Negative.   Gastrointestinal: Negative.   Neurological:  Negative for dizziness and headaches.      Objective    BP (!) 142/73 (BP Location: Right Arm, Patient Position: Sitting, Cuff Size: Large)   Pulse 65   Temp 98.2 F (36.8 C)   Wt 190 lb (86.2 kg)   SpO2 99%   BMI 32.61 kg/m    Physical Exam Constitutional:      Appearance: Normal appearance.  Cardiovascular:     Rate and Rhythm: Normal rate and regular rhythm.     Pulses: Normal pulses.     Heart sounds: Normal heart sounds.  Pulmonary:     Effort: Pulmonary effort is normal.     Breath sounds: Normal breath sounds.  Abdominal:     Palpations: Abdomen is soft.     Tenderness: There is no abdominal tenderness.  Musculoskeletal:     Cervical back:  Neck supple. No tenderness.     Right lower leg: No edema.     Left lower leg: No edema.  Lymphadenopathy:     Cervical: No cervical adenopathy.  Skin:    General: Skin is warm and dry.  Neurological:     Mental Status: She is alert and oriented to person, place, and time. Mental status is at baseline.  Psychiatric:        Mood and Affect: Mood normal.        Behavior: Behavior normal.      No results found for any visits on 03/29/20.  Assessment & Plan     Problem List Items Addressed This Visit      Cardiovascular and Mediastinum   Hypertension - Primary    Well controlled Continue current medications Recheck metabolic panel F/u in 6 months at her physical       Relevant Orders   Comprehensive metabolic panel     Other   Hyperlipidemia    Previously well controlled Continue statin Repeat FLP and CMP Goal LDL < 100       Relevant Orders   Comprehensive metabolic panel   Lipid panel   Prediabetes   Relevant Orders   Hemoglobin A1c   Depression    Chronic and well controlled.  Continue current medications.  Recheck in 6 months.       GAD (generalized anxiety disorder)    Chronic and well controlled.  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  She is also part of a support group that is helping  with her symptoms.  Recheck in 6 months.           Return in about 6 months (around 09/26/2020) for Physical and follow up.      I, Lavon Paganini, MD, have reviewed all documentation for this visit. The documentation on 03/29/20 for the exam, diagnosis, procedures, and orders are all accurate and complete.   Demarko Zeimet, Dionne Bucy, MD, MPH [facility]

## 2020-03-30 LAB — COMPREHENSIVE METABOLIC PANEL
ALT: 18 IU/L (ref 0–32)
AST: 15 IU/L (ref 0–40)
Albumin/Globulin Ratio: 1.6 (ref 1.2–2.2)
Albumin: 4.2 g/dL (ref 3.8–4.9)
Alkaline Phosphatase: 96 IU/L (ref 44–121)
BUN/Creatinine Ratio: 27 — ABNORMAL HIGH (ref 9–23)
BUN: 20 mg/dL (ref 6–24)
Bilirubin Total: 0.3 mg/dL (ref 0.0–1.2)
CO2: 24 mmol/L (ref 20–29)
Calcium: 9.7 mg/dL (ref 8.7–10.2)
Chloride: 101 mmol/L (ref 96–106)
Creatinine, Ser: 0.75 mg/dL (ref 0.57–1.00)
GFR calc Af Amer: 102 mL/min/{1.73_m2} (ref >59)
GFR calc non Af Amer: 88 mL/min/{1.73_m2} (ref >59)
Globulin, Total: 2.7 g/dL (ref 1.5–4.5)
Glucose: 84 mg/dL (ref 65–99)
Potassium: 3.8 mmol/L (ref 3.5–5.2)
Sodium: 141 mmol/L (ref 134–144)
Total Protein: 6.9 g/dL (ref 6.0–8.5)

## 2020-03-30 LAB — LIPID PANEL
Chol/HDL Ratio: 3.6 ratio (ref 0.0–4.4)
Cholesterol, Total: 175 mg/dL (ref 100–199)
HDL: 49 mg/dL (ref >39)
LDL Chol Calc (NIH): 98 mg/dL (ref 0–99)
Triglycerides: 160 mg/dL — ABNORMAL HIGH (ref 0–149)
VLDL Cholesterol Cal: 28 mg/dL (ref 5–40)

## 2020-03-30 LAB — HEMOGLOBIN A1C
Est. average glucose Bld gHb Est-mCnc: 126 mg/dL
Hgb A1c MFr Bld: 6 % — ABNORMAL HIGH (ref 4.8–5.6)

## 2020-04-01 ENCOUNTER — Telehealth: Payer: Self-pay

## 2020-04-01 NOTE — Telephone Encounter (Signed)
-----   Message from Virginia Crews, MD sent at 04/01/2020 12:46 PM EDT ----- Normal/stable labs, except sligt increase in A1c.  Hemoglobin A1c, 3 month avg of blood sugars, is in prediabetic range.  In order to prevent progression to diabetes, recommend low carb diet and regular exercise.

## 2020-04-01 NOTE — Telephone Encounter (Signed)
Written by Virginia Crews, MD on 04/01/2020 12:46 PM EDT Seen by patient Janice Powers on 04/01/2020 12:56 PM

## 2020-04-16 ENCOUNTER — Other Ambulatory Visit: Payer: Self-pay | Admitting: Family Medicine

## 2020-04-16 NOTE — Telephone Encounter (Signed)
Requested medication (s) are due for refill today: Yes  Requested medication (s) are on the active medication list: Yes  Last refill:  03/29/19  Future visit scheduled: Yes  Notes to clinic:  Prescription has expired.    Requested Prescriptions  Pending Prescriptions Disp Refills   hydrochlorothiazide (HYDRODIURIL) 12.5 MG tablet [Pharmacy Med Name: HYDROCHLOROTHIAZIDE 12.5 MG TB] 90 tablet 0    Sig: TAKE 1 TABLET BY MOUTH EVERY DAY      Cardiovascular: Diuretics - Thiazide Failed - 04/16/2020  1:01 PM      Failed - Last BP in normal range    BP Readings from Last 1 Encounters:  03/29/20 (!) 142/73          Passed - Ca in normal range and within 360 days    Calcium  Date Value Ref Range Status  03/29/2020 9.7 8.7 - 10.2 mg/dL Final          Passed - Cr in normal range and within 360 days    Creatinine, Ser  Date Value Ref Range Status  03/29/2020 0.75 0.57 - 1.00 mg/dL Final          Passed - K in normal range and within 360 days    Potassium  Date Value Ref Range Status  03/29/2020 3.8 3.5 - 5.2 mmol/L Final          Passed - Na in normal range and within 360 days    Sodium  Date Value Ref Range Status  03/29/2020 141 134 - 144 mmol/L Final          Passed - Valid encounter within last 6 months    Recent Outpatient Visits           2 weeks ago Essential hypertension   [place], Dionne Bucy, MD   6 months ago Encounter for annual physical exam   [employer] [facility], Dionne Bucy, MD   1 year ago Essential hypertension   [place], Dionne Bucy, MD   1 year ago Essential hypertension   [place], Dionne Bucy, MD   1 year ago Encounter for annual physical exam   [facility] Bacigal[place], Dionne Bucy, MD       Future Appointments             In 5 months Bacigalupo, Dionne Bucy, MD [facility], [place]

## 2020-04-22 ENCOUNTER — Other Ambulatory Visit: Payer: Self-pay | Admitting: Family Medicine

## 2020-04-22 NOTE — Telephone Encounter (Signed)
Requested Prescriptions  Pending Prescriptions Disp Refills   citalopram (CELEXA) 40 MG tablet [Pharmacy Med Name: CITALOPRAM TAB 40MG ] 90 tablet 1    Sig: TAKE 1 TABLET BY MOUTH DAILY     Psychiatry:  Antidepressants - SSRI Passed - 04/22/2020  2:10 PM      Passed - Completed PHQ-2 or PHQ-9 in the last 360 days.      Passed - Valid encounter within last 6 months    Recent Outpatient Visits          3 weeks ago Essential hypertension   [place], Dionne Bucy, MD   6 months ago Encounter for annual physical exam   [facility] [place], Dionne Bucy, MD   1 year ago Essential hypertension   [facility] [place], Dionne Bucy, MD   1 year ago Essential hypertension   [place], Dionne Bucy, MD   1 year ago Encounter for annual physical exam   [facility] Bacigalupo, Dionne Bucy, MD      Future Appointments            In 5 months Bacigalupo, Dionne Bucy, MD [facility] [place]

## 2020-05-02 ENCOUNTER — Encounter: Payer: Self-pay | Admitting: Family Medicine

## 2020-05-14 LAB — HM DIABETES EYE EXAM

## 2020-05-17 ENCOUNTER — Encounter: Payer: Self-pay | Admitting: Family Medicine

## 2020-05-21 ENCOUNTER — Encounter: Payer: Self-pay | Admitting: Family Medicine

## 2020-05-24 ENCOUNTER — Other Ambulatory Visit: Payer: Self-pay | Admitting: Family Medicine

## 2020-05-24 NOTE — Telephone Encounter (Signed)
Requested Prescriptions  Pending Prescriptions Disp Refills  . atorvastatin (LIPITOR) 20 MG tablet [Pharmacy Med Name: ATORVASTATIN 20 MG TABLET] 90 tablet 3    Sig: TAKE 1 TABLET BY MOUTH DAILY     Cardiovascular:  Antilipid - Statins Failed - 05/24/2020  3:54 PM      Failed - LDL in normal range and within 360 days    LDL Chol Calc (NIH)  Date Value Ref Range Status  03/29/2020 98 0 - 99 mg/dL Final         Failed - Triglycerides in normal range and within 360 days    Triglycerides  Date Value Ref Range Status  03/29/2020 160 (H) 0 - 149 mg/dL Final         Passed - Total Cholesterol in normal range and within 360 days    Cholesterol, Total  Date Value Ref Range Status  03/29/2020 175 100 - 199 mg/dL Final         Passed - HDL in normal range and within 360 days    HDL  Date Value Ref Range Status  03/29/2020 49 >39 mg/dL Final         Passed - Patient is not pregnant      Passed - Valid encounter within last 12 months    Recent Outpatient Visits          1 month ago Essential hypertension   [place], Dionne Bucy, MD   8 months ago Encounter for annual physical exam   [facility] [place], Dionne Bucy, MD   1 year ago Essential hypertension   [place], Dionne Bucy, MD   1 year ago Essential hypertension   [place], Dionne Bucy, MD   1 year ago Encounter for annual physical exam   [facility] Bacigalupo, Dionne Bucy, MD      Future Appointments            In 4 months Bacigalupo, Dionne Bucy, MD [facility] [place], [place]           . fluticasone (FLONASE) 50 MCG/ACT nasal spray [Pharmacy Med Name: FLUTICASONE SPR 50MCG] 48 g 0    Sig: PLACE 2 SPRAYS IN EACH NOSTRIL EVERY DAY     Ear, Nose, and Throat: Nasal Preparations - Corticosteroids Passed - 05/24/2020  3:54 PM      Passed - Valid encounter within last 12 months    Recent Outpatient Visits           1 month ago Essential hypertension   [place], Dionne Bucy, MD   8 months ago Encounter for annual physical exam   [facility] [place], Dionne Bucy, MD   1 year ago Essential hypertension   [place], Dionne Bucy, MD   1 year ago Essential hypertension   [place], Dionne Bucy, MD   1 year ago Encounter for annual physical exam   [facility] Bacigalupo, Dionne Bucy, MD      Future Appointments            In 4 months Bacigalupo, Dionne Bucy, MD [facility], [place]

## 2020-06-11 ENCOUNTER — Telehealth: Payer: PRIVATE HEALTH INSURANCE | Admitting: Orthopedic Surgery

## 2020-06-11 DIAGNOSIS — R21 Rash and other nonspecific skin eruption: Secondary | ICD-10-CM

## 2020-06-11 MED ORDER — TRIAMCINOLONE ACETONIDE 0.1 % EX CREA: 1.0000 "application " | TOPICAL_CREAM | Freq: Two times a day (BID) | CUTANEOUS | 0 refills | Status: DC

## 2020-06-11 NOTE — Progress Notes (Signed)
E Visit for Rash  We are sorry that you are not feeling well. Here is how we plan to help!  I will prescribe a stronger topical steroid, triamcinolone 0.1% to use twice daily on the affected areas. Stop using the cortisone. You should see a difference within a week. If not, please arrange to be seen.  HOME CARE:   Take cool showers and avoid direct sunlight.  Apply cool compress or wet dressings.  Take a bath in an oatmeal bath.  Sprinkle content of one Aveeno packet under running faucet with comfortably warm water.  Bathe for 15-20 minutes, 1-2 times daily.  Pat dry with a towel. Do not rub the rash.  Use hydrocortisone cream.  Take an antihistamine like Benadryl for widespread rashes that itch.  The adult dose of Benadryl is 25-50 mg by mouth 4 times daily.  Caution:  This type of medication may cause sleepiness.  Do not drink alcohol, drive, or operate dangerous machinery while taking antihistamines.  Do not take these medications if you have prostate enlargement.  Read package instructions thoroughly on all medications that you take.  GET HELP RIGHT AWAY IF:   Symptoms don't go away after treatment.  Severe itching that persists.  If you rash spreads or swells.  If you rash begins to smell.  If it blisters and opens or develops a yellow-brown crust.  You develop a fever.  You have a sore throat.  You become short of breath.  MAKE SURE YOU:  Understand these instructions. Will watch your condition. Will get help right away if you are not doing well or get worse.  Thank you for choosing an e-visit. Your e-visit answers were reviewed by a board certified advanced clinical practitioner to complete your personal care plan. Depending upon the condition, your plan could have included both over the counter or prescription medications. Please review your pharmacy choice. Be sure that the pharmacy you have chosen is open so that you can pick up your prescription now.  If  there is a problem you may message your provider in [place] to have the prescription routed to another pharmacy. Your safety is important to [place]. If you have drug allergies check your prescription carefully.  For the next 24 hours, you can use MyChart to ask questions about today's visit, request a non-urgent call back, or ask for a work or school excuse from your e-visit provider. You will get an email in the next two days asking about your experience. I hope that your e-visit has been valuable and will speed your recovery.   Greater than 5 minutes, yet less than 10 minutes of time have been spent researching, coordinating and implementing care for this patient today.

## 2020-07-10 ENCOUNTER — Encounter: Payer: Self-pay | Admitting: Family Medicine

## 2020-07-10 ENCOUNTER — Other Ambulatory Visit: Payer: Self-pay | Admitting: Family Medicine

## 2020-07-22 ENCOUNTER — Encounter: Payer: Self-pay | Admitting: Family Medicine

## 2020-07-22 LAB — CBC AND DIFFERENTIAL
HCT: 338 — AB (ref 36–46)
HCT: 38 (ref 36–46)
HCT: 38 (ref 36–46)
Hemoglobin: 12.3 (ref 12.0–16.0)
Neutrophils Absolute: 5.5
Platelets: 227 (ref 150–399)
WBC: 7.8

## 2020-07-22 LAB — BASIC METABOLIC PANEL
BUN: 17 (ref 4–21)
CO2: 31 — AB (ref 13–22)
Chloride: 104 (ref 99–108)
Creatinine: 0.7 (ref 0.5–1.1)
Glucose: 101
Potassium: 4 (ref 3.4–5.3)
Sodium: 138 (ref 137–147)

## 2020-07-22 LAB — CBC
RBC: 4.21 (ref 3.87–5.11)
RBC: 4.21 (ref 3.87–5.11)

## 2020-07-22 LAB — COMPREHENSIVE METABOLIC PANEL
Albumin: 4.3 (ref 3.5–5.0)
Calcium: 10 (ref 8.7–10.7)
GFR calc non Af Amer: 90

## 2020-07-22 LAB — HEPATIC FUNCTION PANEL
ALT: 20 (ref 7–35)
AST: 22 (ref 13–35)
Alkaline Phosphatase: 80 (ref 25–125)

## 2020-08-20 ENCOUNTER — Other Ambulatory Visit: Payer: Self-pay | Admitting: Family Medicine

## 2020-08-20 DIAGNOSIS — Z1231 Encounter for screening mammogram for malignant neoplasm of breast: Secondary | ICD-10-CM

## 2020-10-01 ENCOUNTER — Other Ambulatory Visit: Payer: Self-pay | Admitting: Family Medicine

## 2020-10-01 NOTE — Telephone Encounter (Signed)
   Notes to clinic: Patient has appointment schedule for 10/04/2020 Review for 3 day supply or wait until appt    Requested Prescriptions  Pending Prescriptions Disp Refills   citalopram (CELEXA) 40 MG tablet [Pharmacy Med Name: CITALOPRAM TAB 40MG ] 90 tablet 0    Sig: TAKE 1 TABLET BY MOUTH DAILY      Psychiatry:  Antidepressants - SSRI Failed - 10/01/2020 12:56 PM      Failed - Valid encounter within last 6 months    Recent Outpatient Visits           6 months ago Essential hypertension   [place], Dionne Bucy, MD   1 year ago Encounter for annual physical exam   [facility] [place], Dionne Bucy, MD   1 year ago Essential hypertension   [place], Dionne Bucy, MD   2 years ago Essential hypertension   [place], Dionne Bucy, MD   2 years ago Encounter for annual physical exam   [facility] Bacigalupo, Dionne Bucy, MD       Future Appointments             In 3 days Bacigalupo, Dionne Bucy, MD [facility], [facility]             Passed - Completed PHQ-2 or PHQ-9 in the last 360 days

## 2020-10-01 NOTE — Telephone Encounter (Signed)
Requested Prescriptions  Pending Prescriptions Disp Refills  . hydrochlorothiazide (HYDRODIURIL) 12.5 MG tablet [Pharmacy Med Name: HYDROCHLOROTH 12.5 MG TAB] 30 tablet 0    Sig: TAKE 1 TABLET BY MOUTH EVERY DAY     Cardiovascular: Diuretics - Thiazide Failed - 10/01/2020  1:26 PM      Failed - Last BP in normal range    BP Readings from Last 1 Encounters:  03/29/20 (!) 142/73         Failed - Valid encounter within last 6 months    Recent Outpatient Visits          6 months ago Essential hypertension   [facility] [place], Dionne Bucy, MD   1 year ago Encounter for annual physical exam   [facility] [place], Dionne Bucy, MD   1 year ago Essential hypertension   [place], Dionne Bucy, MD   2 years ago Essential hypertension   [place], Dionne Bucy, MD   2 years ago Encounter for annual physical exam   [facility] [place], Dionne Bucy, MD      Future Appointments            In 3 days Bacigalupo, Dionne Bucy, MD [facility], [place] in normal range and within 360 days    Calcium  Date Value Ref Range Status  03/29/2020 9.7 8.7 - 10.2 mg/dL Final         Passed - Cr in normal range and within 360 days    Creatinine, Ser  Date Value Ref Range Status  03/29/2020 0.75 0.57 - 1.00 mg/dL Final         Passed - K in normal range and within 360 days    Potassium  Date Value Ref Range Status  03/29/2020 3.8 3.5 - 5.2 mmol/L Final         Passed - Na in normal range and within 360 days    Sodium  Date Value Ref Range Status  03/29/2020 141 134 - 144 mmol/L Final

## 2020-10-04 ENCOUNTER — Encounter: Payer: Self-pay | Admitting: Family Medicine

## 2020-10-04 ENCOUNTER — Ambulatory Visit
Admission: RE | Admit: 2020-10-04 | Discharge: 2020-10-04 | Disposition: A | Discharge status: Home / Self Care | Payer: PRIVATE HEALTH INSURANCE | Source: Ambulatory Visit | Attending: Family Medicine | Admitting: Family Medicine

## 2020-10-04 ENCOUNTER — Other Ambulatory Visit: Payer: Self-pay

## 2020-10-04 ENCOUNTER — Ambulatory Visit (INDEPENDENT_AMBULATORY_CARE_PROVIDER_SITE_OTHER): Payer: PRIVATE HEALTH INSURANCE | Admitting: Family Medicine

## 2020-10-04 VITALS — BP 125/80 | HR 74 | Temp 98.4°F | Resp 16 | Ht 64.0 in | Wt 194.1 lb

## 2020-10-04 DIAGNOSIS — Z6832 Body mass index (BMI) 32.0-32.9, adult: Secondary | ICD-10-CM | POA: Diagnosis not present

## 2020-10-04 DIAGNOSIS — E7800 Pure hypercholesterolemia, unspecified: Secondary | ICD-10-CM

## 2020-10-04 DIAGNOSIS — I10 Essential (primary) hypertension: Secondary | ICD-10-CM | POA: Diagnosis not present

## 2020-10-04 DIAGNOSIS — Z1231 Encounter for screening mammogram for malignant neoplasm of breast: Secondary | ICD-10-CM | POA: Diagnosis present

## 2020-10-04 DIAGNOSIS — E669 Obesity, unspecified: Secondary | ICD-10-CM | POA: Diagnosis not present

## 2020-10-04 DIAGNOSIS — R002 Palpitations: Secondary | ICD-10-CM | POA: Diagnosis not present

## 2020-10-04 DIAGNOSIS — R7303 Prediabetes: Secondary | ICD-10-CM

## 2020-10-04 DIAGNOSIS — E66811 Obesity, class 1: Secondary | ICD-10-CM

## 2020-10-04 DIAGNOSIS — Z0000 Encounter for general adult medical examination without abnormal findings: Secondary | ICD-10-CM

## 2020-10-04 DIAGNOSIS — J301 Allergic rhinitis due to pollen: Secondary | ICD-10-CM

## 2020-10-04 NOTE — Assessment & Plan Note (Signed)
Previously well controlled Continue statin Repeat FLP and CMP Goal LDL <100

## 2020-10-04 NOTE — Assessment & Plan Note (Signed)
Slightly elevated here, but well controlled on home readings Continue current medications Recheck metabolic panel Follow-up in 6 months

## 2020-10-04 NOTE — Assessment & Plan Note (Signed)
New problem Reviewed ER course, which is reassuring Check TSH Reviewed recent CMP and CBC Needs 14-day heart monitor -attempted to order in place Zio patch today, but it seems that we are not completely set up in the system for this yet [facility] cardiology referral, but patient prefers to wait until we are able to give her a Zio patch instead

## 2020-10-04 NOTE — Assessment & Plan Note (Signed)
Discussed importance of healthy weight management Discussed diet and exercise  

## 2020-10-04 NOTE — Progress Notes (Signed)
Complete physical exam   Patient: Janice Powers   DOB: Dec 05, 1960   60 y.o. Female  MRN: [id number] Visit Date: 10/04/2020  Today's healthcare provider: Lavon Paganini, MD   Chief Complaint  Patient presents with  . Annual Exam   Subjective    Janice Powers is a 60 y.o. female who presents today for a complete physical exam.  She reports consuming a general diet. Home exercise routine includes walking 1 hrs per week. She generally feels well. She reports sleeping well. She does have additional problems to discuss today.  HPI   Nasal congestion and postnasal drip - taking flonase, claritin  ER 2 months ago for chest pain and palpitations - normal XRay, CXR, labs  For chest pain, she finds that acid reflux meds, aleve, or relaxation can help No SOB, diaphoresis, syncope associated  Past Medical History:  Diagnosis Date  . Cancer ([place])    skin  . Cataract   . Depression   . Hyperlipidemia   . Hypertension   . Prediabetes   . Recurrent sinusitis    Past Surgical History:  Procedure Laterality Date  . ABDOMINAL HYSTERECTOMY     supracervical  . EYE SURGERY Bilateral    lasik   Social History   Socioeconomic History  . Marital status: Divorced    Spouse name: Not on file  . Number of children: 0  . Years of education: 16  . Highest education level: Bachelor's degree (e.g., BA, AB, BS)  Occupational History  . Occupation: Nurse, adult: [employer]  Tobacco Use  . Smoking status: Former Smoker    Packs/day: 0.50    Years: 1.00    Pack years: 0.50    Types: Cigarettes    Quit date: 07/12/1982    Years since quitting: 38.2  . Smokeless tobacco: Never Used  Vaping Use  . Vaping Use: Never used  Substance and Sexual Activity  . Alcohol use: Yes    Alcohol/week: 4.0 standard drinks    Types: 2 Shots of liquor, 2 Glasses of wine per week  . Drug use: No  . Sexual activity: Never  Other Topics Concern  . Not on file  Social History Narrative  .  Not on file   Social Determinants of Health   Financial Resource Strain: Not on file  Food Insecurity: Not on file  Transportation Needs: Not on file  Physical Activity: Not on file  Stress: Not on file  Social Connections: Not on file  Intimate Partner Violence: Not on file   Family Status  Relation Name Status  . Mother  Deceased  . Father  Alive  . Brother  Deceased  . MGM  (Not Specified)  . MGF  (Not Specified)  . PGF  (Not Specified)  . Neg Hx  (Not Specified)   Family History  Problem Relation Age of Onset  . Multiple myeloma Mother        mets to colon  . Diabetes Father   . COPD Father        smoker  . Other Father        alpha 1 anti-trypsin deficiency  . Skin cancer Father        non-melanoma  . Congenital heart disease Brother   . Diabetes Maternal Grandmother   . Stroke Maternal Grandfather   . Stroke Paternal Grandfather   . Breast cancer Neg Hx   . Ovarian cancer Neg Hx   . Cervical cancer Neg  Hx   . Colon cancer Neg Hx    Allergies  Allergen Reactions  . Neomycin Rash  . Penicillins Rash  . Sulfa Antibiotics Rash    Patient Care Team: Virginia Crews, MD as PCP - General (Family Medicine)   Medications: Outpatient Medications Prior to Visit  Medication Sig  . atorvastatin (LIPITOR) 20 MG tablet TAKE 1 TABLET BY MOUTH DAILY  . BIOTIN PO Take by mouth.  . citalopram (CELEXA) 40 MG tablet TAKE 1 TABLET BY MOUTH DAILY  . fluticasone (FLONASE) 50 MCG/ACT nasal spray PLACE 2 SPRAYS IN EACH NOSTRIL EVERY DAY  . hydrochlorothiazide (HYDRODIURIL) 12.5 MG tablet TAKE 1 TABLET BY MOUTH EVERY DAY  . Multiple Vitamins-Minerals (CENTRUM SILVER PO) Take by mouth.  . naproxen sodium (ALEVE) 220 MG tablet Take 220 mg by mouth daily as needed.   . Omega-3 Fatty Acids (FISH OIL PO) Take by mouth.  . Potassium 99 MG TABS Take by mouth daily.   . psyllium (METAMUCIL) 58.6 % powder Take 1 packet by mouth 3 (three) times daily.  Marland Kitchen triamcinolone (KENALOG)  0.1 % Apply 1 application topically 2 (two) times daily.   No facility-administered medications prior to visit.    Review of Systems  Constitutional: Negative.   HENT: Positive for postnasal drip, rhinorrhea and sneezing.   Eyes: Negative.   Respiratory: Negative.   Cardiovascular: Negative.   Gastrointestinal: Negative.   Endocrine: Negative.   Genitourinary: Negative.   Musculoskeletal: Negative.   Skin: Negative.   Allergic/Immunologic: Positive for environmental allergies.  Neurological: Negative.   Hematological: Negative.   Psychiatric/Behavioral: Negative.     Last CBC Lab Results  Component Value Date   WBC 7.8 07/22/2020   HGB 12.3 07/22/2020   HCT 338 (A) 07/22/2020   HCT 38 07/22/2020   HCT 38 07/22/2020   MCV 89 03/29/2019   MCH 30.0 03/29/2019   RDW 11.8 03/29/2019   PLT 227 80/16/5537   Last metabolic panel Lab Results  Component Value Date   GLUCOSE 84 03/29/2020   NA 138 07/22/2020   K 4.0 07/22/2020   CL 104 07/22/2020   CO2 31 (A) 07/22/2020   BUN 17 07/22/2020   CREATININE 0.7 07/22/2020   GFRNONAA 90 07/22/2020   GFRAA 102 03/29/2020   CALCIUM 10.0 07/22/2020   PROT 6.9 03/29/2020   ALBUMIN 4.3 07/22/2020   LABGLOB 2.7 03/29/2020   AGRATIO 1.6 03/29/2020   BILITOT 0.3 03/29/2020   ALKPHOS 80 07/22/2020   AST 22 07/22/2020   ALT 20 07/22/2020   Last lipids Lab Results  Component Value Date   CHOL 175 03/29/2020   HDL 49 03/29/2020   LDLCALC 98 03/29/2020   TRIG 160 (H) 03/29/2020   CHOLHDL 3.6 03/29/2020   Last hemoglobin A1c Lab Results  Component Value Date   HGBA1C 6.0 (H) 03/29/2020   Last thyroid functions Lab Results  Component Value Date   TSH 0.79 08/31/2016      Objective    BP 125/80 Comment: home reading BP Readings from Last 3 Encounters:  10/04/20 125/80  03/29/20 (!) 142/73  09/27/19 134/85   Wt Readings from Last 3 Encounters:  03/29/20 190 lb (86.2 kg)  09/27/19 188 lb 6.4 oz (85.5 kg)  03/29/19  193 lb (87.5 kg)      Physical Exam Vitals reviewed.  Constitutional:      General: She is not in acute distress.    Appearance: Normal appearance. She is well-developed. She is not diaphoretic.  HENT:     Head: Normocephalic and atraumatic.     Right Ear: Tympanic membrane, ear canal and external ear normal.     Left Ear: Tympanic membrane, ear canal and external ear normal.     Nose: Nose normal.     Mouth/Throat:     Mouth: Mucous membranes are moist.     Pharynx: Oropharynx is clear. No oropharyngeal exudate.  Eyes:     General: No scleral icterus.    Conjunctiva/sclera: Conjunctivae normal.     Pupils: Pupils are equal, round, and reactive to light.  Neck:     Thyroid: No thyromegaly.  Cardiovascular:     Rate and Rhythm: Normal rate and regular rhythm.     Pulses: Normal pulses.     Heart sounds: Normal heart sounds. No murmur heard.   Pulmonary:     Effort: Pulmonary effort is normal. No respiratory distress.     Breath sounds: Normal breath sounds. No wheezing or rales.  Abdominal:     General: There is no distension.     Palpations: Abdomen is soft.     Tenderness: There is no abdominal tenderness.  Musculoskeletal:        General: No deformity.     Cervical back: Neck supple.     Right lower leg: No edema.     Left lower leg: No edema.  Lymphadenopathy:     Cervical: No cervical adenopathy.  Skin:    General: Skin is warm and dry.     Findings: No rash.  Neurological:     Mental Status: She is alert and oriented to person, place, and time. Mental status is at baseline.     Sensory: No sensory deficit.     Motor: No weakness.     Gait: Gait normal.  Psychiatric:        Mood and Affect: Mood normal.        Behavior: Behavior normal.        Thought Content: Thought content normal.       Last depression screening scores PHQ 2/9 Scores 10/04/2020 03/29/2020 09/27/2019  PHQ - 2 Score 0 1 0  PHQ- 9 Score 0 2 2   Last fall risk screening Fall Risk   10/04/2020  Falls in the past year? 0  Number falls in past yr: 0  Injury with Fall? 0  Risk for fall due to : No Fall Risks  Follow up Falls evaluation completed   Last Audit-C alcohol use screening Alcohol Use Disorder Test (AUDIT) 10/04/2020  1. How often do you have a drink containing alcohol? 1  2. How many drinks containing alcohol do you have on a typical day when you are drinking? 0  3. How often do you have six or more drinks on one occasion? 0  AUDIT-C Score 1  Alcohol Brief Interventions/Follow-up AUDIT Score <7 follow-up not indicated   A score of 3 or more in women, and 4 or more in men indicates increased risk for alcohol abuse, EXCEPT if all of the points are from question 1   Results for orders placed or performed in visit on 92/33/00  Basic metabolic panel  Result Value Ref Range   Glucose 101    BUN 17 4 - 21   CO2 31 (A) 13 - 22   Potassium 4.0 3.4 - 5.3   Sodium 138 137 - 147   Chloride 104 99 - 108  Comprehensive metabolic panel  Result Value Ref Range   GFR  calc non Af Amer 90    Calcium 10.0 8.7 - 10.7   Albumin 4.3 3.5 - 5.0  Hepatic function panel  Result Value Ref Range   Alkaline Phosphatase 80 25 - 125   ALT 20 7 - 35   AST 22 13 - 35  CBC and differential  Result Value Ref Range   Hemoglobin 12.3 12.0 - 16.0   Platelets 227 150 - 399   WBC 7.8   CBC  Result Value Ref Range   RBC 4.21 3.87 - 4.16  Basic metabolic panel  Result Value Ref Range   Creatinine 0.7 0.5 - 1.1  CBC and differential  Result Value Ref Range   HCT 338 (A) 36 - 46  CBC and differential  Result Value Ref Range   HCT 38 36 - 46  CBC and differential  Result Value Ref Range   HCT 38 36 - 46   Neutrophils Absolute 5.50   CBC  Result Value Ref Range   RBC 4.21 3.87 - 5.11    Assessment & Plan    Routine Health Maintenance and Physical Exam  Exercise Activities and Dietary recommendations Goals   None     Immunization History  Administered Date(s)  Administered  . Influenza,inj,Quad PF,6+ Mos 03/13/2017, 04/02/2018, 03/29/2019  . Influenza-Unspecified 03/27/2020  . Moderna Sars-Covid-2 Vaccination 10/05/2019, 11/02/2019, 05/18/2020  . Tdap 09/01/2014  . Zoster 10/29/2011  . Zoster Recombinat (Shingrix) 11/18/2019, 02/03/2020    Health Maintenance  Topic Date Due  . MAMMOGRAM  10/03/2021  . COLONOSCOPY (Pts 45-62yr Insurance coverage will need to be confirmed)  12/29/2021  . TETANUS/TDAP  09/01/2024  . PAP SMEAR-Modifier  09/26/2024  . INFLUENZA VACCINE  Completed  . COVID-19 Vaccine  Completed  . Hepatitis C Screening  Completed  . HIV Screening  Completed  . HPV VACCINES  Aged Out    Discussed health benefits of physical activity, and encouraged her to engage in regular exercise appropriate for her age and condition.  Problem List Items Addressed This Visit      Cardiovascular and Mediastinum   Hypertension    Slightly elevated here, but well controlled on home readings Continue current medications Recheck metabolic panel Follow-up in 6 months        Respiratory   Allergic rhinitis    Continue OTC antihistamine and Flonase Can use nasal saline as well No signs of sinusitis        Other   Hyperlipidemia    Previously well controlled Continue statin Repeat FLP and CMP Goal LDL <100      Relevant Orders   Lipid panel   Prediabetes    Recommend low carb diet Recheck A1c       Relevant Orders   Hemoglobin A1c   Obesity    Discussed importance of healthy weight management Discussed diet and exercise       Palpitations    New problem Reviewed ER course, which is reassuring Check TSH Reviewed recent CMP and CBC Needs 14-day heart monitor -attempted to order in place Zio patch today, but it seems that we are not completely set up in the system for this yet Offered cardiology referral, but patient prefers to wait until we are able to give her a Zio patch instead      Relevant Orders   TSH    LONG TERM MONITOR-LIVE TELEMETRY (3-14 DAYS)    Other Visit Diagnoses    Encounter for annual physical exam    -  Primary   Relevant Orders   Hemoglobin A1c   Lipid panel   TSH       Return in about 6 months (around 04/06/2021) for chronic disease f/u.     I, Lavon Paganini, MD, have reviewed all documentation for this visit. The documentation on 10/04/20 for the exam, diagnosis, procedures, and orders are all accurate and complete.   Bacigalupo, Dionne Bucy, MD, MPH [facility]

## 2020-10-04 NOTE — Assessment & Plan Note (Signed)
Recommend low carb diet °Recheck A1c  °

## 2020-10-04 NOTE — Patient Instructions (Signed)
Preventive Care 59-59 Years Old, Female Preventive care refers to lifestyle choices and visits with your health care provider that can promote health and wellness. This includes:  A yearly physical exam. This is also called an annual wellness visit.  Regular dental and eye exams.  Immunizations.  Screening for certain conditions.  Healthy lifestyle choices, such as: ? Eating a healthy diet. ? Getting regular exercise. ? Not using drugs or products that contain nicotine and tobacco. ? Limiting alcohol use. What can I expect for my preventive care visit? Physical exam Your health care provider will check your:  Height and weight. These may be used to calculate your BMI (body mass index). BMI is a measurement that tells if you are at a healthy weight.  Heart rate and blood pressure.  Body temperature.  Skin for abnormal spots. Counseling Your health care provider may ask you questions about your:  Past medical problems.  Family's medical history.  Alcohol, tobacco, and drug use.  Emotional well-being.  Home life and relationship well-being.  Sexual activity.  Diet, exercise, and sleep habits.  Work and work Statistician.  Access to firearms.  Method of birth control.  Menstrual cycle.  Pregnancy history. What immunizations do I need? Vaccines are usually given at various ages, according to a schedule. Your health care provider will recommend vaccines for you based on your age, medical history, and lifestyle or other factors, such as travel or where you work.   What tests do I need? Blood tests  Lipid and cholesterol levels. These may be checked every 5 years, or more often if you are over 59 years old.  Hepatitis C test.  Hepatitis B test. Screening  Lung cancer screening. You may have this screening every year starting at age 59 if you have a 30-pack-year history of smoking and currently smoke or have quit within the past 15 years.  Colorectal cancer  screening. ? All adults should have this screening starting at age 59 and continuing until age 17. ? Your health care provider may recommend screening at age 59 if you are at increased risk. ? You will have tests every 1-10 years, depending on your results and the type of screening test.  Diabetes screening. ? This is done by checking your blood sugar (glucose) after you have not eaten for a while (fasting). ? You may have this done every 1-59 years.  Mammogram. ? This may be done every 1-59 years. ? Talk with your health care provider about when you should start having regular mammograms. This may depend on whether you have a family history of breast cancer.  BRCA-related cancer screening. This may be done if you have a family history of breast, ovarian, tubal, or peritoneal cancers.  Pelvic exam and Pap test. ? This may be done every 3 years starting at age 59. ? Starting at age 59, this may be done every 5 years if you have a Pap test in combination with an HPV test. Other tests  STD (sexually transmitted disease) testing, if you are at risk.  Bone density scan. This is done to screen for osteoporosis. You may have this scan if you are at high risk for osteoporosis. Talk with your health care provider about your test results, treatment options, and if necessary, the need for more tests. Follow these instructions at home: Eating and drinking  Eat a diet that includes fresh fruits and vegetables, whole grains, lean protein, and low-fat dairy products.  Take vitamin and mineral supplements  as recommended by your health care provider.  Do not drink alcohol if: ? Your health care provider tells you not to drink. ? You are pregnant, may be pregnant, or are planning to become pregnant.  If you drink alcohol: ? Limit how much you have to 0-1 drink a day. ? Be aware of how much alcohol is in your drink. In the [place] one drink equals one 12 oz bottle of beer (355 mL), one 5 oz glass of  wine (148 mL), or one 1 oz glass of hard liquor (44 mL).   Lifestyle  Take daily care of your teeth and gums. Brush your teeth every morning and night with fluoride toothpaste. Floss one time each day.  Stay active. Exercise for at least 30 minutes 5 or more days each week.  Do not use any products that contain nicotine or tobacco, such as cigarettes, e-cigarettes, and chewing tobacco. If you need help quitting, ask your health care provider.  Do not use drugs.  If you are sexually active, practice safe sex. Use a condom or other form of protection to prevent STIs (sexually transmitted infections).  If you do not wish to become pregnant, use a form of birth control. If you plan to become pregnant, see your health care provider for a prepregnancy visit.  If told by your health care provider, take low-dose aspirin daily starting at age 59.  Find healthy ways to cope with stress, such as: ? Meditation, yoga, or listening to music. ? Journaling. ? Talking to a trusted person. ? Spending time with friends and family. Safety  Always wear your seat belt while driving or riding in a vehicle.  Do not drive: ? If you have been drinking alcohol. Do not ride with someone who has been drinking. ? When you are tired or distracted. ? While texting.  Wear a helmet and other protective equipment during sports activities.  If you have firearms in your house, make sure you follow all gun safety procedures. What's next?  Visit your health care provider once a year for an annual wellness visit.  Ask your health care provider how often you should have your eyes and teeth checked.  Stay up to date on all vaccines. This information is not intended to replace advice given to you by your health care provider. Make sure you discuss any questions you have with your health care provider. Document Revised: 04/02/2020 Document Reviewed: 03/10/2018 Elsevier Patient Education  2021 Elsevier Inc.  

## 2020-10-04 NOTE — Assessment & Plan Note (Signed)
Continue OTC antihistamine and Flonase Can use nasal saline as well No signs of sinusitis

## 2020-10-05 LAB — LIPID PANEL
Chol/HDL Ratio: 3.1 ratio (ref 0.0–4.4)
Cholesterol, Total: 160 mg/dL (ref 100–199)
HDL: 51 mg/dL (ref >39)
LDL Chol Calc (NIH): 86 mg/dL (ref 0–99)
Triglycerides: 133 mg/dL (ref 0–149)
VLDL Cholesterol Cal: 23 mg/dL (ref 5–40)

## 2020-10-05 LAB — HEMOGLOBIN A1C
Est. average glucose Bld gHb Est-mCnc: 117 mg/dL
Hgb A1c MFr Bld: 5.7 % — ABNORMAL HIGH (ref 4.8–5.6)

## 2020-10-05 LAB — TSH: TSH: 1.01 u[IU]/mL (ref 0.450–4.500)

## 2020-10-23 ENCOUNTER — Encounter: Payer: Self-pay | Admitting: Family Medicine

## 2020-10-24 ENCOUNTER — Other Ambulatory Visit: Payer: Self-pay | Admitting: Family Medicine

## 2020-10-28 NOTE — Telephone Encounter (Signed)
Copied from [place] [id number]. Topic: General - Other >> Oct 28, 2020  8:17 AM Leward Quan A wrote: Reason for CRM: Patient called in to say that she will be coming in on 10/31/20 at around 8.30 AM to be fitted for a Xio XT heart monitor Ph# [phone]

## 2020-11-19 ENCOUNTER — Other Ambulatory Visit: Payer: Self-pay | Admitting: Family Medicine

## 2020-11-19 NOTE — Telephone Encounter (Signed)
Requested Prescriptions  Pending Prescriptions Disp Refills  . hydrochlorothiazide (HYDRODIURIL) 12.5 MG tablet [Pharmacy Med Name: HYDROCHLOROTH 12.5 MG TAB] 90 tablet 1    Sig: TAKE 1 TABLET BY MOUTH EVERY DAY     Cardiovascular: Diuretics - Thiazide Passed - 11/19/2020  6:08 PM      Passed - Ca in normal range and within 360 days    Calcium  Date Value Ref Range Status  07/22/2020 10.0 8.7 - 10.7 Final         Passed - Cr in normal range and within 360 days    Creatinine  Date Value Ref Range Status  07/22/2020 0.7 0.5 - 1.1 Final   Creatinine, Ser  Date Value Ref Range Status  03/29/2020 0.75 0.57 - 1.00 mg/dL Final         Passed - K in normal range and within 360 days    Potassium  Date Value Ref Range Status  07/22/2020 4.0 3.4 - 5.3 Final         Passed - Na in normal range and within 360 days    Sodium  Date Value Ref Range Status  07/22/2020 138 137 - 147 Final         Passed - Last BP in normal range    BP Readings from Last 1 Encounters:  10/04/20 125/80         Passed - Valid encounter within last 6 months    Recent Outpatient Visits          1 month ago Encounter for annual physical exam   [facility], Dionne Bucy, MD   7 months ago Essential hypertension   [facility] [place], Dionne Bucy, MD   1 year ago Encounter for annual physical exam   [facility], Dionne Bucy, MD   1 year ago Essential hypertension   [place], Dionne Bucy, MD   2 years ago Essential hypertension   [place], Dionne Bucy, MD      Future Appointments            In 4 months Bacigalupo, Dionne Bucy, MD [facility], [place]

## 2020-12-05 ENCOUNTER — Encounter: Payer: Self-pay | Admitting: Family Medicine

## 2020-12-05 ENCOUNTER — Other Ambulatory Visit: Payer: Self-pay | Admitting: Family Medicine

## 2020-12-05 DIAGNOSIS — R002 Palpitations: Secondary | ICD-10-CM

## 2020-12-05 NOTE — Progress Notes (Deleted)
Zio patch from 10/31/2020 to 11/12/2020 reviewed and interpreted.  Preliminary findings show minimum heart rate of 47 bpm with max heart rate of 159 bpm and average heart rate of 69 bpm.  Predominant underlying rhythm was sinus rhythm.  Isolated SVE's were rare, SVE couplets were rare, and no SVE triplets were present.  Isolated VE's were rare and no VE couplets or VE triplets were present.  Agree with interpretation.  No worrisome arrhythmia.  No need for medication at this time.

## 2020-12-05 NOTE — Progress Notes (Unsigned)
Zio patch from 10/31/2020 to 11/12/2020 reviewed and interpreted.  Preliminary findings show minimum heart rate of 47 bpm with max heart rate of 159 bpm and average heart rate of 69 bpm.  Predominant underlying rhythm was sinus rhythm.  Isolated SVE's were rare, SVE couplets were rare, and no SVE triplets were present.  Isolated VE's were rare and no VE couplets or VE triplets were present.  Agree with interpretation.  No worrisome arrhythmia.  No need for medication at this time.

## 2020-12-05 NOTE — Progress Notes (Signed)
Zio patch from 10/31/2020 to 11/12/2020 reviewed and interpreted.  Preliminary findings show minimum heart rate of 47 bpm with max heart rate of 159 bpm and average heart rate of 69 bpm.  Predominant underlying rhythm was sinus rhythm.  Isolated SVE's were rare, SVE couplets were rare, and no SVE triplets were present.  Isolated VE's were rare and no VE couplets or VE triplets were present.  Agree with interpretation.  No worrisome arrhythmia.  No need for medication at this time.

## 2020-12-24 ENCOUNTER — Other Ambulatory Visit: Payer: Self-pay | Admitting: Family Medicine

## 2021-01-20 ENCOUNTER — Ambulatory Visit: Payer: PRIVATE HEALTH INSURANCE | Admitting: Family Medicine

## 2021-02-05 ENCOUNTER — Telehealth: Payer: PRIVATE HEALTH INSURANCE

## 2021-02-05 ENCOUNTER — Telehealth: Payer: No Typology Code available for payment source | Admitting: Physician Assistant

## 2021-02-05 DIAGNOSIS — S1086XA Insect bite of other specified part of neck, initial encounter: Secondary | ICD-10-CM | POA: Diagnosis not present

## 2021-02-05 DIAGNOSIS — W57XXXA Bitten or stung by nonvenomous insect and other nonvenomous arthropods, initial encounter: Secondary | ICD-10-CM

## 2021-02-05 MED ORDER — PREDNISONE 10 MG PO TABS: ORAL_TABLET | ORAL | 0 refills | Status: AC

## 2021-02-05 NOTE — Progress Notes (Signed)
Virtual Visit Consent   Janice Powers, you are scheduled for a virtual visit with a [facility] provider today.     Just as with appointments in the office, your consent must be obtained to participate.  Your consent will be active for this visit and any virtual visit you may have with one of our providers in the next 365 days.     If you have a MyChart account, a copy of this consent can be sent to you electronically.  All virtual visits are billed to your insurance company just like a traditional visit in the office.    As this is a virtual visit, video technology does not allow for your provider to perform a traditional examination.  This may limit your provider's ability to fully assess your condition.  If your provider identifies any concerns that need to be evaluated in person or the need to arrange testing (such as labs, EKG, etc.), we will make arrangements to do so.     Although advances in technology are sophisticated, we cannot ensure that it will always work on either your end or our end.  If the connection with a video visit is poor, the visit may have to be switched to a telephone visit.  With either a video or telephone visit, we are not always able to ensure that we have a secure connection.     I need to obtain your verbal consent now.   Are you willing to proceed with your visit today?    [name] has provided verbal consent on 02/05/2021 for a virtual visit (video or telephone).   Janice Powers, [place]   Date: 02/05/2021 6:33 PM   Virtual Visit via Video Note   I, Janice Powers, connected with  [name]  ([id number], 59-Sep-1962) on 02/05/21 at  6:15 PM EDT by a video-enabled telemedicine application and verified that I am speaking with the correct person using two identifiers.  Location: Patient: Virtual Visit Location Patient: Home Provider: Virtual Visit Location Provider: Home Office   I discussed the limitations of evaluation and management by  telemedicine and the availability of in person appointments. The patient expressed understanding and agreed to proceed.    History of Present Illness: Janice Powers is a 60 y.o. who identifies as a female who was assigned female at birth, and is being seen today for pruritic rash of left neck over the past few days. Notes she has been outside doing a lot of yard work and has sustained multiple insect bites which have been moderately bothersome, but controlled with use of OTC steroid creams and Benadryl cream. The area of neck is now with a larger area of redness and is more significant pruritic. Denies pain, hardness of skin or drainage. Denies fever, chills, malaise. Has history of needing prednisone for significant bites like this.  HPI: HPI  Problems:  Patient Active Problem List   Diagnosis Date Noted   Palpitations 10/04/2020   Episode of recurrent major depressive disorder ([name]) 09/27/2019   GAD (generalized anxiety disorder) 03/29/2019   Muscle spasm 09/21/2018   Right hip pain 09/21/2018   Contact dermatitis of external ear 09/21/2018   Allergic rhinitis 09/21/2018   Dry eye 09/21/2018   Obesity 02/04/2018   Hypertension 08/06/2017   Hyperlipidemia 08/06/2017   Prediabetes 08/06/2017   Cataract 08/06/2017   History of nonmelanoma skin cancer 08/06/2017   Hearing loss 08/06/2017   Eczema 08/06/2017   Metatarsalgia of right foot 08/06/2017  Recurrent sinusitis    Depression     Allergies:  Allergies  Allergen Reactions   Neomycin Rash   Penicillins Rash   Sulfa Antibiotics Rash   Medications:  Current Outpatient Medications:    predniSONE (DELTASONE) 10 MG tablet, Take 4 tablets (40 mg total) by mouth daily with breakfast for 2 days, THEN 3 tablets (30 mg total) daily with breakfast for 2 days, THEN 2 tablets (20 mg total) daily with breakfast for 2 days, THEN 1 tablet (10 mg total) daily with breakfast for 2 days., Disp: 20 tablet, Rfl: 0   atorvastatin (LIPITOR) 20 MG  tablet, TAKE 1 TABLET BY MOUTH DAILY, Disp: 90 tablet, Rfl: 3   BIOTIN PO, Take by mouth., Disp: , Rfl:    citalopram (CELEXA) 40 MG tablet, TAKE 1 TABLET BY MOUTH DAILY, Disp: 90 tablet, Rfl: 0   fluticasone (FLONASE) 50 MCG/ACT nasal spray, PLACE 2 SPRAYS IN EACH NOSTRIL EVERY DAY, Disp: 48 g, Rfl: 3   hydrochlorothiazide (HYDRODIURIL) 12.5 MG tablet, TAKE 1 TABLET BY MOUTH EVERY DAY, Disp: 90 tablet, Rfl: 1   Multiple Vitamins-Minerals (CENTRUM SILVER PO), Take by mouth., Disp: , Rfl:    naproxen sodium (ALEVE) 220 MG tablet, Take 220 mg by mouth daily as needed. , Disp: , Rfl:    Omega-3 Fatty Acids (FISH OIL PO), Take by mouth., Disp: , Rfl:    Potassium 99 MG TABS, Take by mouth daily. , Disp: , Rfl:    psyllium (METAMUCIL) 58.6 % powder, Take 1 packet by mouth 3 (three) times daily., Disp: , Rfl:    triamcinolone (KENALOG) 0.1 %, Apply 1 application topically 2 (two) times daily., Disp: 30 g, Rfl: 0  Observations/Objective: Patient is well-developed, well-nourished in no acute distress.  Resting comfortably at home.  Head is normocephalic, atraumatic.  No labored breathing. Speech is clear and coherent with logical content.  Patient is alert and oriented at baseline.  3-4 cm area of erythema noted of left neck viewed via screen share. No evidence of drainage, ulceration or vesicles. No true "rash" noted  Assessment and Plan: 1. Insect bite of other part of neck, initial encounter - predniSONE (DELTASONE) 10 MG tablet; Take 4 tablets (40 mg total) by mouth daily with breakfast for 2 days, THEN 3 tablets (30 mg total) daily with breakfast for 2 days, THEN 2 tablets (20 mg total) daily with breakfast for 2 days, THEN 1 tablet (10 mg total) daily with breakfast for 2 days.  Dispense: 20 tablet; Refill: 0 With delayed hypersensitivity reaction. No evidence of superimposed cellulitis. Supportive measures and OTC medications reviewed. Will start prednisone taper. Reviewed signs/symptoms of  cellulitis. She is to notify us or her PCP if symptoms are not resolving, anything worsens or new symptoms develop.  Follow Up Instructions: I discussed the assessment and treatment plan with the patient. The patient was provided an opportunity to ask questions and all were answered. The patient agreed with the plan and demonstrated an understanding of the instructions.  A copy of instructions were sent to the patient via MyChart.  The patient was advised to call back or seek an in-person evaluation if the symptoms worsen or if the condition fails to improve as anticipated.  Time:  I spent 15 minutes with the patient via telehealth technology discussing the above problems/concerns.    Janice Rio, PA-C

## 2021-02-05 NOTE — Patient Instructions (Signed)
  Jayme Cloud, thank you for joining [name], PA-C for today's virtual visit.  While this provider is not your primary care provider (PCP), if your PCP is located in our provider database this encounter information will be shared with them immediately following your visit.  Consent: (Patient) Janice Powers provided verbal consent for this virtual visit at the beginning of the encounter.  Current Medications:  Current Outpatient Medications:    atorvastatin (LIPITOR) 20 MG tablet, TAKE 1 TABLET BY MOUTH DAILY, Disp: 90 tablet, Rfl: 3   BIOTIN PO, Take by mouth., Disp: , Rfl:    citalopram (CELEXA) 40 MG tablet, TAKE 1 TABLET BY MOUTH DAILY, Disp: 90 tablet, Rfl: 0   fluticasone (FLONASE) 50 MCG/ACT nasal spray, PLACE 2 SPRAYS IN EACH NOSTRIL EVERY DAY, Disp: 48 g, Rfl: 3   hydrochlorothiazide (HYDRODIURIL) 12.5 MG tablet, TAKE 1 TABLET BY MOUTH EVERY DAY, Disp: 90 tablet, Rfl: 1   Multiple Vitamins-Minerals (CENTRUM SILVER PO), Take by mouth., Disp: , Rfl:    naproxen sodium (ALEVE) 220 MG tablet, Take 220 mg by mouth daily as needed. , Disp: , Rfl:    Omega-3 Fatty Acids (FISH OIL PO), Take by mouth., Disp: , Rfl:    Potassium 99 MG TABS, Take by mouth daily. , Disp: , Rfl:    psyllium (METAMUCIL) 58.6 % powder, Take 1 packet by mouth 3 (three) times daily., Disp: , Rfl:    triamcinolone (KENALOG) 0.1 %, Apply 1 application topically 2 (two) times daily., Disp: 30 g, Rfl: 0   Medications ordered in this encounter:  No orders of the defined types were placed in this encounter.    *If you need refills on other medications prior to your next appointment, please contact your pharmacy*  Follow-Up: Call back or seek an in-person evaluation if the symptoms worsen or if the condition fails to improve as anticipated.  Other Instructions Keep skin clean and dry. Please apply cold compresses to the area to help with itch and inflammation. Ok to continue OTC eczema cream and your  triamcinolone ointment.  Use the steroid taper as directed.  If symptoms are not resolving, anything worsens or new symptoms develop, you will need an in-person evaluation.   If you have been instructed to have an in-person evaluation today at a local Urgent Care facility, please use the link below. It will take you to a list of all of our available [facility], including address, phone number and hours of operation. Please do not delay care.  [facility]  If you or a family member do not have a primary care provider, use the link below to schedule a visit and establish care. When you choose a [facility] primary care physician or advanced practice provider, you gain a long-term partner in health. Find a Primary Care Provider  Learn more about [facility] in-office and virtual care options: [facility]

## 2021-03-06 ENCOUNTER — Telehealth: Payer: No Typology Code available for payment source | Admitting: Physician Assistant

## 2021-03-06 DIAGNOSIS — L237 Allergic contact dermatitis due to plants, except food: Secondary | ICD-10-CM | POA: Diagnosis not present

## 2021-03-06 MED ORDER — PREDNISONE 10 MG PO TABS: ORAL_TABLET | ORAL | 0 refills | Status: AC

## 2021-03-06 NOTE — Progress Notes (Signed)
Virtual Visit Consent   Janice Powers, you are scheduled for a virtual visit with a [facility] provider today.     Just as with appointments in the office, your consent must be obtained to participate.  Your consent will be active for this visit and any virtual visit you may have with one of our providers in the next 365 days.     If you have a MyChart account, a copy of this consent can be sent to you electronically.  All virtual visits are billed to your insurance company just like a traditional visit in the office.    As this is a virtual visit, video technology does not allow for your provider to perform a traditional examination.  This may limit your provider's ability to fully assess your condition.  If your provider identifies any concerns that need to be evaluated in person or the need to arrange testing (such as labs, EKG, etc.), we will make arrangements to do so.     Although advances in technology are sophisticated, we cannot ensure that it will always work on either your end or our end.  If the connection with a video visit is poor, the visit may have to be switched to a telephone visit.  With either a video or telephone visit, we are not always able to ensure that we have a secure connection.     I need to obtain your verbal consent now.   Are you willing to proceed with your visit today?    Janice Powers has provided verbal consent on 03/06/2021 for a virtual visit (video or telephone).   Janice Powers, [place]   Date: 03/06/2021 7:37 AM   Virtual Visit via Video Note   I, Janice Powers, connected with  Janice Powers  ([id number], March 05, 1961) on 03/06/21 at  7:30 AM EDT by a video-enabled telemedicine application and verified that I am speaking with the correct person using two identifiers.  Location: Patient: Virtual Visit Location Patient: Home Provider: Virtual Visit Location Provider: Home Office   I discussed the limitations of evaluation and management by  telemedicine and the availability of in person appointments. The patient expressed understanding and agreed to proceed.    History of Present Illness: Janice Powers is a 60 y.o. who identifies as a female who was assigned female at birth, and is being seen today for poison ivy dermatitis. She notes 3 days of a papulovesicular, erythematous and pruritic rash of bilateral forearms in linear distribution with a big patchy area on the L elbow with weeping. Denies fever, chills, fatigue. Notes symptoms starting after working in the yard, coming into contact with poison ivy. States she was wearing gloves so thought she was protected but must have somehow gotten onto arms. Has history of significant dermatitis from rhus plants. Is using OTC Benadryl cream and an old Rx for Triamcinolone to the areas with only some relief. She is worried about this worsening.   HPI: HPI  Problems:  Patient Active Problem List   Diagnosis Date Noted   Palpitations 10/04/2020   Episode of recurrent major depressive disorder ([place]) 09/27/2019   GAD (generalized anxiety disorder) 03/29/2019   Muscle spasm 09/21/2018   Right hip pain 09/21/2018   Contact dermatitis of external ear 09/21/2018   Allergic rhinitis 09/21/2018   Dry eye 09/21/2018   Obesity 02/04/2018   Hypertension 08/06/2017   Hyperlipidemia 08/06/2017   Prediabetes 08/06/2017   Cataract 08/06/2017   History of nonmelanoma skin cancer 08/06/2017  Hearing loss 08/06/2017   Eczema 08/06/2017   Metatarsalgia of right foot 08/06/2017   Recurrent sinusitis    Depression     Allergies:  Allergies  Allergen Reactions   Neomycin Rash   Penicillins Rash   Sulfa Antibiotics Rash   Medications:  Current Outpatient Medications:    predniSONE (DELTASONE) 10 MG tablet, Take 4 tablets (40 mg total) by mouth daily with breakfast for 4 days, THEN 3 tablets (30 mg total) daily with breakfast for 4 days, THEN 2 tablets (20 mg total) daily with breakfast for 3 days,  THEN 1 tablet (10 mg total) daily with breakfast for 3 days., Disp: 37 tablet, Rfl: 0   atorvastatin (LIPITOR) 20 MG tablet, TAKE 1 TABLET BY MOUTH DAILY, Disp: 90 tablet, Rfl: 3   BIOTIN PO, Take by mouth., Disp: , Rfl:    citalopram (CELEXA) 40 MG tablet, TAKE 1 TABLET BY MOUTH DAILY, Disp: 90 tablet, Rfl: 0   fluticasone (FLONASE) 50 MCG/ACT nasal spray, PLACE 2 SPRAYS IN EACH NOSTRIL EVERY DAY, Disp: 48 g, Rfl: 3   hydrochlorothiazide (HYDRODIURIL) 12.5 MG tablet, TAKE 1 TABLET BY MOUTH EVERY DAY, Disp: 90 tablet, Rfl: 1   Multiple Vitamins-Minerals (CENTRUM SILVER PO), Take by mouth., Disp: , Rfl:    naproxen sodium (ALEVE) 220 MG tablet, Take 220 mg by mouth daily as needed. , Disp: , Rfl:    Omega-3 Fatty Acids (FISH OIL PO), Take by mouth., Disp: , Rfl:    Potassium 99 MG TABS, Take by mouth daily. , Disp: , Rfl:    psyllium (METAMUCIL) 58.6 % powder, Take 1 packet by mouth 3 (three) times daily., Disp: , Rfl:    triamcinolone (KENALOG) 0.1 %, Apply 1 application topically 2 (two) times daily., Disp: 30 g, Rfl: 0  Observations/Objective: Patient is well-developed, well-nourished in no acute distress.  Resting comfortably at home.  Head is normocephalic, atraumatic.  No labored breathing. Speech is clear and coherent with logical content.  Patient is alert and oriented at baseline.  Erythematous rash noted of forearms with large cluster around her L elbow moving proximally.   Assessment and Plan: 1. Allergic contact dermatitis due to plants, except food - predniSONE (DELTASONE) 10 MG tablet; Take 4 tablets (40 mg total) by mouth daily with breakfast for 4 days, THEN 3 tablets (30 mg total) daily with breakfast for 4 days, THEN 2 tablets (20 mg total) daily with breakfast for 3 days, THEN 1 tablet (10 mg total) daily with breakfast for 3 days.  Dispense: 37 tablet; Refill: 0 Will have her continue use of OTC benadryl and her triamcinolone. Will also have her start non-drowsy  antihistamine like Xyzal. Can apply astringent to the weeping areas to help dry them up. Rx Prednisone taper -- 14 days to prevent rebound dermatitis.   Follow Up Instructions: I discussed the assessment and treatment plan with the patient. The patient was provided an opportunity to ask questions and all were answered. The patient agreed with the plan and demonstrated an understanding of the instructions.  A copy of instructions were sent to the patient via MyChart.  The patient was advised to call back or seek an in-person evaluation if the symptoms worsen or if the condition fails to improve as anticipated.  Time:  I spent 8 minutes with the patient via telehealth technology discussing the above problems/concerns.    Janice Rio, PA-C

## 2021-03-06 NOTE — Patient Instructions (Signed)
  Jayme Cloud, thank you for joining [name], PA-C for today's virtual visit.  While this provider is not your primary care provider (PCP), if your PCP is located in our provider database this encounter information will be shared with them immediately following your visit.  Consent: (Patient) Janice Powers provided verbal consent for this virtual visit at the beginning of the encounter.  Current Medications:  Current Outpatient Medications:    atorvastatin (LIPITOR) 20 MG tablet, TAKE 1 TABLET BY MOUTH DAILY, Disp: 90 tablet, Rfl: 3   BIOTIN PO, Take by mouth., Disp: , Rfl:    citalopram (CELEXA) 40 MG tablet, TAKE 1 TABLET BY MOUTH DAILY, Disp: 90 tablet, Rfl: 0   fluticasone (FLONASE) 50 MCG/ACT nasal spray, PLACE 2 SPRAYS IN EACH NOSTRIL EVERY DAY, Disp: 48 g, Rfl: 3   hydrochlorothiazide (HYDRODIURIL) 12.5 MG tablet, TAKE 1 TABLET BY MOUTH EVERY DAY, Disp: 90 tablet, Rfl: 1   Multiple Vitamins-Minerals (CENTRUM SILVER PO), Take by mouth., Disp: , Rfl:    naproxen sodium (ALEVE) 220 MG tablet, Take 220 mg by mouth daily as needed. , Disp: , Rfl:    Omega-3 Fatty Acids (FISH OIL PO), Take by mouth., Disp: , Rfl:    Potassium 99 MG TABS, Take by mouth daily. , Disp: , Rfl:    psyllium (METAMUCIL) 58.6 % powder, Take 1 packet by mouth 3 (three) times daily., Disp: , Rfl:    triamcinolone (KENALOG) 0.1 %, Apply 1 application topically 2 (two) times daily., Disp: 30 g, Rfl: 0   Medications ordered in this encounter:  No orders of the defined types were placed in this encounter.    *If you need refills on other medications prior to your next appointment, please contact your pharmacy*  Follow-Up: Call back or seek an in-person evaluation if the symptoms worsen or if the condition fails to improve as anticipated.  Other Instructions Please try to avoid rubbing/scratching the areas. You can apply an astringent like Witch Hazel to the area to help soothe the skin and dry up weeping  areas.  Consider starting an antihistamine like Claritin or Xyzal.  You can continue your Triamcinolone cream. Take the steroid taper as directed.   If symptoms are not resolving, anything worsens or new symptoms develop, you need an in-person evaluation.  If you have been instructed to have an in-person evaluation today at a local Urgent Care facility, please use the link below. It will take you to a list of all of our available [facility], including address, phone number and hours of operation. Please do not delay care.  [facility]  If you or a family member do not have a primary care provider, use the link below to schedule a visit and establish care. When you choose a [facility] primary care physician or advanced practice provider, you gain a long-term partner in health. Find a Primary Care Provider  Learn more about [facility]'s in-office and virtual care options: [facility]

## 2021-03-18 ENCOUNTER — Other Ambulatory Visit: Payer: Self-pay | Admitting: Family Medicine

## 2021-04-10 ENCOUNTER — Ambulatory Visit: Payer: Self-pay | Admitting: Family Medicine

## 2021-04-13 ENCOUNTER — Encounter: Payer: Self-pay | Admitting: Family Medicine

## 2021-04-18 ENCOUNTER — Other Ambulatory Visit: Payer: Self-pay | Admitting: Family Medicine

## 2021-04-28 ENCOUNTER — Other Ambulatory Visit: Payer: Self-pay | Admitting: Family Medicine

## 2021-06-09 ENCOUNTER — Other Ambulatory Visit: Payer: Self-pay | Admitting: Family Medicine

## 2021-06-11 ENCOUNTER — Other Ambulatory Visit: Payer: Self-pay | Admitting: Family Medicine

## 2021-06-25 NOTE — Progress Notes (Signed)
Established patient visit   Patient: Janice Powers   DOB: 09-Feb-1961   60 y.o. Female  MRN: [id number] Visit Date: 06/26/2021  Today's healthcare provider: Lavon Paganini, MD   Chief Complaint  Patient presents with   Follow-up    HTN   Subjective    HPI HPI     Follow-up    Additional comments: HTN      Last edited by Doristine Devoid, CMA on 06/26/2021  8:10 AM.      Hypertension, follow-up  BP Readings from Last 3 Encounters:  06/26/21 123/79  10/04/20 125/80  03/29/20 (!) 142/73   Wt Readings from Last 3 Encounters:  06/26/21 186 lb (84.4 kg)  10/04/20 194 lb 1.6 oz (88 kg)  03/29/20 190 lb (86.2 kg)     She was last seen for hypertension 6 months ago.  BP at that visit was 125/80. Management since that visit includes continue current medication. She reports excellent compliance with treatment. She is not having side effects.  She is exercising. She is adherent to low salt diet.   Outside blood pressures are 120's/80's. --------------------------------------------------------------------------------------------------- Lipid/Cholesterol, follow-up  Last Lipid Panel: Lab Results  Component Value Date   CHOL 160 10/04/2020   LDLCALC 86 10/04/2020   HDL 51 10/04/2020   TRIG 133 10/04/2020    She was last seen for this 6 months ago.  Management since that visit includes Continue Statin.  She reports excellent compliance with treatment. She is not having side effects.   Symptoms: No appetite changes No foot ulcerations  No chest pain No chest pressure/discomfort  No dyspnea No orthopnea  No fatigue No lower extremity edema  No palpitations No paroxysmal nocturnal dyspnea  No nausea No numbness or tingling of extremity  No polydipsia No polyuria  No speech difficulty No syncope   She is following a Regular diet. Current exercise: walking  Last metabolic panel Lab Results  Component Value Date   GLUCOSE 84 03/29/2020   NA 138  07/22/2020   K 4.0 07/22/2020   BUN 17 07/22/2020   CREATININE 0.7 07/22/2020   GFRNONAA 90 07/22/2020   CALCIUM 10.0 07/22/2020   AST 22 07/22/2020   ALT 20 07/22/2020   The 10-year ASCVD risk score (Arnett DK, et al., 2019) is: 3.2%  ---------------------------------------------------------------------------------------------------     Medications: Outpatient Medications Prior to Visit  Medication Sig   BIOTIN PO Take by mouth.   fluticasone (FLONASE) 50 MCG/ACT nasal spray PLACE 2 SPRAYS IN EACH NOSTRIL EVERY DAY   Multiple Vitamins-Minerals (CENTRUM SILVER PO) Take by mouth.   naproxen sodium (ALEVE) 220 MG tablet Take 220 mg by mouth daily as needed.    Omega-3 Fatty Acids (FISH OIL PO) Take by mouth.   Potassium 99 MG TABS Take by mouth daily.    triamcinolone (KENALOG) 0.1 % Apply 1 application topically 2 (two) times daily.   [DISCONTINUED] atorvastatin (LIPITOR) 20 MG tablet TAKE 1 TABLET BY MOUTH DAILY   [DISCONTINUED] citalopram (CELEXA) 40 MG tablet TAKE 1 TABLET BY MOUTH DAILY   [DISCONTINUED] hydrochlorothiazide (HYDRODIURIL) 12.5 MG tablet TAKE 1 TABLET BY MOUTH EVERY DAY   [DISCONTINUED] psyllium (METAMUCIL) 58.6 % powder Take 1 packet by mouth 3 (three) times daily.   No facility-administered medications prior to visit.    Review of Systems per HPI     Objective    BP 123/79 (BP Location: Left Arm, Patient Position: Sitting, Cuff Size: Large)    Pulse 64  Temp 98.3 F (36.8 C) (Oral)    Resp 16    Ht 5\' 4"  (1.626 m)    Wt 186 lb (84.4 kg)    BMI 31.93 kg/m  {Show previous vital signs (optional):23777}  Physical Exam Vitals reviewed.  Constitutional:      General: She is not in acute distress.    Appearance: Normal appearance. She is well-developed. She is not diaphoretic.  HENT:     Head: Normocephalic and atraumatic.  Eyes:     General: No scleral icterus.    Conjunctiva/sclera: Conjunctivae normal.  Neck:     Thyroid: No thyromegaly.   Cardiovascular:     Rate and Rhythm: Normal rate and regular rhythm.     Pulses: Normal pulses.     Heart sounds: Normal heart sounds. No murmur heard. Pulmonary:     Effort: Pulmonary effort is normal. No respiratory distress.     Breath sounds: Normal breath sounds. No wheezing, rhonchi or rales.  Musculoskeletal:     Cervical back: Neck supple.     Right lower leg: No edema.     Left lower leg: No edema.  Lymphadenopathy:     Cervical: No cervical adenopathy.  Skin:    General: Skin is warm and dry.     Findings: No rash.  Neurological:     Mental Status: She is alert and oriented to person, place, and time. Mental status is at baseline.  Psychiatric:        Mood and Affect: Mood normal.        Behavior: Behavior normal.      No results found for any visits on 06/26/21.  Assessment & Plan     Problem List Items Addressed This Visit       Cardiovascular and Mediastinum   Hypertension - Primary    Well controlled Continue current medications Recheck metabolic panel F/u in 6 months       Relevant Medications   atorvastatin (LIPITOR) 20 MG tablet   hydrochlorothiazide (HYDRODIURIL) 12.5 MG tablet   Other Relevant Orders   Comprehensive metabolic panel     Other   Hyperlipidemia    Previously well controlled Continue statin Repeat FLP and CMP      Relevant Medications   atorvastatin (LIPITOR) 20 MG tablet   hydrochlorothiazide (HYDRODIURIL) 12.5 MG tablet   Other Relevant Orders   Comprehensive metabolic panel   Lipid panel   Prediabetes    Recommend low carb diet Recheck A1c       Relevant Orders   Hemoglobin A1c   Obesity    Congratulated on weight loss Discussed importance of healthy weight management Discussed diet and exercise       GAD (generalized anxiety disorder)    Chronic and stable Up and down with caregiver stress Continue celexa at current dose      Relevant Medications   citalopram (CELEXA) 40 MG tablet   Episode of  recurrent major depressive disorder (HCC)    Chronic and stable Up and down with caregiver stress Continue celexa at current dose      Relevant Medications   citalopram (CELEXA) 40 MG tablet     Return in about 6 months (around 12/25/2021) for CPE.      I, Lavon Paganini, MD, have reviewed all documentation for this visit. The documentation on 06/26/21 for the exam, diagnosis, procedures, and orders are all accurate and complete.   Nicky Kras, Dionne Bucy, MD, MPH [facility]

## 2021-06-26 ENCOUNTER — Ambulatory Visit (INDEPENDENT_AMBULATORY_CARE_PROVIDER_SITE_OTHER): Payer: No Typology Code available for payment source | Admitting: Family Medicine

## 2021-06-26 ENCOUNTER — Other Ambulatory Visit: Payer: Self-pay

## 2021-06-26 ENCOUNTER — Encounter: Payer: Self-pay | Admitting: Family Medicine

## 2021-06-26 VITALS — BP 123/79 | HR 64 | Temp 98.3°F | Resp 16 | Ht 64.0 in | Wt 186.0 lb

## 2021-06-26 DIAGNOSIS — E7800 Pure hypercholesterolemia, unspecified: Secondary | ICD-10-CM

## 2021-06-26 DIAGNOSIS — F411 Generalized anxiety disorder: Secondary | ICD-10-CM

## 2021-06-26 DIAGNOSIS — I10 Essential (primary) hypertension: Secondary | ICD-10-CM | POA: Diagnosis not present

## 2021-06-26 DIAGNOSIS — E669 Obesity, unspecified: Secondary | ICD-10-CM | POA: Diagnosis not present

## 2021-06-26 DIAGNOSIS — R7303 Prediabetes: Secondary | ICD-10-CM

## 2021-06-26 DIAGNOSIS — Z6831 Body mass index (BMI) 31.0-31.9, adult: Secondary | ICD-10-CM

## 2021-06-26 DIAGNOSIS — F330 Major depressive disorder, recurrent, mild: Secondary | ICD-10-CM

## 2021-06-26 MED ORDER — ATORVASTATIN CALCIUM 20 MG PO TABS: 20.0000 mg | ORAL_TABLET | Freq: Every day | ORAL | 3 refills | Status: DC

## 2021-06-26 MED ORDER — CITALOPRAM HYDROBROMIDE 40 MG PO TABS: 40.0000 mg | ORAL_TABLET | Freq: Every day | ORAL | 3 refills | Status: DC

## 2021-06-26 MED ORDER — HYDROCHLOROTHIAZIDE 12.5 MG PO TABS: 12.5000 mg | ORAL_TABLET | Freq: Every day | ORAL | 3 refills | Status: DC

## 2021-06-26 NOTE — Assessment & Plan Note (Signed)
Chronic and stable Up and down with caregiver stress Continue celexa at current dose

## 2021-06-26 NOTE — Assessment & Plan Note (Signed)
Congratulated on weight loss ?Discussed importance of healthy weight management ?Discussed diet and exercise  ?

## 2021-06-26 NOTE — Assessment & Plan Note (Signed)
Recommend low carb diet °Recheck A1c  °

## 2021-06-26 NOTE — Assessment & Plan Note (Signed)
Previously well controlled Continue statin Repeat FLP and CMP  

## 2021-06-26 NOTE — Assessment & Plan Note (Signed)
Well controlled Continue current medications Recheck metabolic panel F/u in 6 months  

## 2021-06-27 LAB — LIPID PANEL
Chol/HDL Ratio: 3.6 ratio (ref 0.0–4.4)
Cholesterol, Total: 174 mg/dL (ref 100–199)
HDL: 49 mg/dL (ref >39)
LDL Chol Calc (NIH): 104 mg/dL — ABNORMAL HIGH (ref 0–99)
Triglycerides: 116 mg/dL (ref 0–149)
VLDL Cholesterol Cal: 21 mg/dL (ref 5–40)

## 2021-06-27 LAB — COMPREHENSIVE METABOLIC PANEL
ALT: 28 IU/L (ref 0–32)
AST: 16 IU/L (ref 0–40)
Albumin/Globulin Ratio: 1.7 (ref 1.2–2.2)
Albumin: 4.5 g/dL (ref 3.8–4.9)
Alkaline Phosphatase: 101 IU/L (ref 44–121)
BUN/Creatinine Ratio: 24 — ABNORMAL HIGH (ref 9–23)
BUN: 17 mg/dL (ref 6–24)
Bilirubin Total: 0.2 mg/dL (ref 0.0–1.2)
CO2: 26 mmol/L (ref 20–29)
Calcium: 9.8 mg/dL (ref 8.7–10.2)
Chloride: 101 mmol/L (ref 96–106)
Creatinine, Ser: 0.72 mg/dL (ref 0.57–1.00)
Globulin, Total: 2.6 g/dL (ref 1.5–4.5)
Glucose: 86 mg/dL (ref 70–99)
Potassium: 4.1 mmol/L (ref 3.5–5.2)
Sodium: 141 mmol/L (ref 134–144)
Total Protein: 7.1 g/dL (ref 6.0–8.5)
eGFR: 96 mL/min/{1.73_m2} (ref >59)

## 2021-06-27 LAB — HEMOGLOBIN A1C
Est. average glucose Bld gHb Est-mCnc: 117 mg/dL
Hgb A1c MFr Bld: 5.7 % — ABNORMAL HIGH (ref 4.8–5.6)

## 2021-07-15 ENCOUNTER — Encounter: Payer: Self-pay | Admitting: Family Medicine

## 2021-07-15 MED ORDER — TRIAMCINOLONE ACETONIDE 0.1 % EX CREA: 1.0000 "application " | TOPICAL_CREAM | Freq: Two times a day (BID) | CUTANEOUS | 2 refills | Status: DC

## 2021-07-15 NOTE — Telephone Encounter (Signed)
Ok to send refill with 2 additional refills please

## 2021-08-25 ENCOUNTER — Other Ambulatory Visit: Payer: Self-pay | Admitting: Family Medicine

## 2021-08-25 DIAGNOSIS — Z1231 Encounter for screening mammogram for malignant neoplasm of breast: Secondary | ICD-10-CM

## 2021-09-02 ENCOUNTER — Encounter: Payer: Self-pay | Admitting: Family Medicine

## 2021-09-02 DIAGNOSIS — E66811 Body mass index (BMI) 32.0-32.9, adult: Secondary | ICD-10-CM

## 2021-09-02 DIAGNOSIS — R7303 Prediabetes: Secondary | ICD-10-CM

## 2021-09-02 DIAGNOSIS — I10 Essential (primary) hypertension: Secondary | ICD-10-CM

## 2021-09-02 DIAGNOSIS — E7800 Pure hypercholesterolemia, unspecified: Secondary | ICD-10-CM

## 2021-09-02 DIAGNOSIS — E669 Obesity, unspecified: Secondary | ICD-10-CM

## 2021-09-04 NOTE — Telephone Encounter (Signed)
Please place referral to medical nutrition therapy and add atrium in the comments. Associate with obesity or overweight diagnosis and any complications

## 2021-10-07 ENCOUNTER — Ambulatory Visit
Admission: RE | Admit: 2021-10-07 | Discharge: 2021-10-07 | Disposition: A | Discharge status: Home / Self Care | Payer: No Typology Code available for payment source | Source: Ambulatory Visit | Attending: Family Medicine | Admitting: Family Medicine

## 2021-10-07 ENCOUNTER — Other Ambulatory Visit: Payer: Self-pay

## 2021-10-07 DIAGNOSIS — Z1231 Encounter for screening mammogram for malignant neoplasm of breast: Secondary | ICD-10-CM | POA: Insufficient documentation

## 2021-10-08 ENCOUNTER — Other Ambulatory Visit: Payer: Self-pay | Admitting: Family Medicine

## 2021-10-08 DIAGNOSIS — R928 Other abnormal and inconclusive findings on diagnostic imaging of breast: Secondary | ICD-10-CM

## 2021-10-08 DIAGNOSIS — N6489 Other specified disorders of breast: Secondary | ICD-10-CM

## 2021-11-12 ENCOUNTER — Ambulatory Visit
Admission: RE | Admit: 2021-11-12 | Discharge: 2021-11-12 | Disposition: A | Discharge status: Home / Self Care | Payer: No Typology Code available for payment source | Source: Ambulatory Visit | Attending: Family Medicine | Admitting: Family Medicine

## 2021-11-12 DIAGNOSIS — N6489 Other specified disorders of breast: Secondary | ICD-10-CM

## 2021-11-12 DIAGNOSIS — R928 Other abnormal and inconclusive findings on diagnostic imaging of breast: Secondary | ICD-10-CM

## 2021-12-15 ENCOUNTER — Encounter: Payer: Self-pay | Admitting: Family Medicine

## 2021-12-15 DIAGNOSIS — Z1211 Encounter for screening for malignant neoplasm of colon: Secondary | ICD-10-CM

## 2021-12-16 ENCOUNTER — Telehealth: Payer: Self-pay

## 2021-12-16 NOTE — Telephone Encounter (Signed)
CALLED PATIENT NO ANSWER LEFT VOICEMAIL FOR A CALL BACK ? ?

## 2021-12-16 NOTE — Telephone Encounter (Signed)
Last Colonoscopy was done by [facility]. Not sure id she is asking to be refer to someone else.  Looked for the tetanus vaccine in [facility] to see if it had been documented but no documentation. I can't update her immunization.   Referral order for GI pended.

## 2021-12-17 ENCOUNTER — Telehealth: Payer: Self-pay

## 2021-12-17 ENCOUNTER — Other Ambulatory Visit: Payer: Self-pay

## 2021-12-17 DIAGNOSIS — Z1211 Encounter for screening for malignant neoplasm of colon: Secondary | ICD-10-CM

## 2021-12-17 MED ORDER — NA SULFATE-K SULFATE-MG SULF 17.5-3.13-1.6 GM/177ML PO SOLN: 1.0000 | Freq: Once | ORAL | 0 refills | Status: AC

## 2021-12-17 NOTE — Telephone Encounter (Signed)
ROI was requested by [facility] in [place]  to be fax to their office in order for them to release the date of vaccine given. Will sent a ROI form with patient info without a signature. Fax number provided [phone]. Form faxed.

## 2021-12-17 NOTE — Telephone Encounter (Signed)
Gastroenterology Pre-Procedure Review  Request Date: 01/08/22 Requesting Physician: Dr. Marius Ditch  PATIENT REVIEW QUESTIONS: The patient responded to the following health history questions as indicated:    1. Are you having any GI issues? no 2. Do you have a personal history of Polyps? yes (pt states she has had tubular adenomas 4 years ago no record of this as it was performed in [place]) 3. Do you have a family history of Colon Cancer or Polyps? no 4. Diabetes Mellitus? no 5. Joint replacements in the past 12 months?no 6. Major health problems in the past 3 months?no 7. Any artificial heart valves, MVP, or defibrillator?no    MEDICATIONS & ALLERGIES:    Patient reports the following regarding taking any anticoagulation/antiplatelet therapy:   Plavix, Coumadin, Eliquis, Xarelto, Lovenox, Pradaxa, Brilinta, or Effient? no Aspirin? no  Patient confirms/reports the following medications:  Current Outpatient Medications  Medication Sig Dispense Refill   atorvastatin (LIPITOR) 20 MG tablet Take 1 tablet (20 mg total) by mouth daily. 90 tablet 3   BIOTIN PO Take by mouth.     citalopram (CELEXA) 40 MG tablet Take 1 tablet (40 mg total) by mouth daily. 90 tablet 3   fluticasone (FLONASE) 50 MCG/ACT nasal spray PLACE 2 SPRAYS IN EACH NOSTRIL EVERY DAY 48 g 3   hydrochlorothiazide (HYDRODIURIL) 12.5 MG tablet Take 1 tablet (12.5 mg total) by mouth daily. 90 tablet 3   Multiple Vitamins-Minerals (CENTRUM SILVER PO) Take by mouth.     naproxen sodium (ALEVE) 220 MG tablet Take 220 mg by mouth daily as needed.      Omega-3 Fatty Acids (FISH OIL PO) Take by mouth.     Potassium 99 MG TABS Take by mouth daily.      triamcinolone cream (KENALOG) 0.1 % Apply 1 application topically 2 (two) times daily. 30 g 2   No current facility-administered medications for this visit.    Patient confirms/reports the following allergies:  Allergies  Allergen Reactions   Neomycin Rash   Penicillins Rash    Sulfa Antibiotics Rash    No orders of the defined types were placed in this encounter.   AUTHORIZATION INFORMATION Primary Insurance: 1D#: Group #:  Secondary Insurance: 1D#: Group #:  SCHEDULE INFORMATION: Date: 01/08/22 Time: Location: [place]

## 2021-12-23 NOTE — Progress Notes (Signed)
Complete physical exam   Patient: Janice Powers   DOB: [birth year]   61 y.o. Female  MRN: [id number] Visit Date: 12/26/2021  Today's healthcare provider: Lavon Paganini, MD   Chief Complaint  Patient presents with   Annual Exam   Subjective    Janice Powers is a 61 y.o. female who presents today for a complete physical exam.  She reports consuming the dash diet most of the time diet. The patient does not participate in regular exercise at present. She generally feels well. She reports sleeping well. She does not have additional problems to discuss today.   Patient is fasting today for blood work.  She has no complaints and is up to date on health maintenance issues.  Past Medical History:  Diagnosis Date   Cancer ([place])    skin   Cataract    Depression    Hyperlipidemia    Hypertension    Prediabetes    Recurrent sinusitis    Past Surgical History:  Procedure Laterality Date   ABDOMINAL HYSTERECTOMY     supracervical   EYE SURGERY Bilateral    lasik   Social History   Socioeconomic History   Marital status: Divorced    Spouse name: Not on file   Number of children: 0   Years of education: 16   Highest education level: Bachelor's degree (e.g., BA, AB, BS)  Occupational History   Occupation: Nurse, adult: [employer]  Tobacco Use   Smoking status: Former    Packs/day: 0.50    Years: 1.00    Total pack years: 0.50    Types: Cigarettes    Quit date: 07/12/1982    Years since quitting: 39.4   Smokeless tobacco: Never  Vaping Use   Vaping Use: Never used  Substance and Sexual Activity   Alcohol use: Yes    Alcohol/week: 4.0 standard drinks of alcohol    Types: 2 Shots of liquor, 2 Glasses of wine per week   Drug use: No   Sexual activity: Never  Other Topics Concern   Not on file  Social History Narrative   Not on file   Social Determinants of Health   Financial Resource Strain: Low Risk  (08/06/2017)   Overall Financial Resource Strain  (CARDIA)    Difficulty of Paying Living Expenses: Not hard at all  Food Insecurity: No Food Insecurity (08/06/2017)   Hunger Vital Sign    Worried About Running Out of Food in the Last Year: Never true    Ran Out of Food in the Last Year: Never true  Transportation Needs: No Transportation Needs (08/06/2017)   PRAPARE - Hydrologist (Medical): No    Lack of Transportation (Non-Medical): No  Physical Activity: Sufficiently Active (08/06/2017)   Exercise Vital Sign    Days of Exercise per Week: 5 days    Minutes of Exercise per Session: 30 min  Stress: Not on file  Social Connections: Not on file  Intimate Partner Violence: Not on file   Family Status  Relation Name Status   Mother  Deceased   Father  Alive   Brother  Deceased   MGM  (Not Specified)   MGF  (Not Specified)   PGF  (Not Specified)   Neg Hx  (Not Specified)   Family History  Problem Relation Age of Onset   Multiple myeloma Mother        mets to colon   Diabetes Father  COPD Father        smoker   Other Father        alpha 1 anti-trypsin deficiency   Skin cancer Father        non-melanoma   Congenital heart disease Brother    Diabetes Maternal Grandmother    Stroke Maternal Grandfather    Stroke Paternal Grandfather    Breast cancer Neg Hx    Ovarian cancer Neg Hx    Cervical cancer Neg Hx    Colon cancer Neg Hx    Allergies  Allergen Reactions   Neomycin Rash   Penicillins Rash   Sulfa Antibiotics Rash    Patient Care Team: Virginia Crews, MD as PCP - General (Family Medicine)   Medications: Outpatient Medications Prior to Visit  Medication Sig   atorvastatin (LIPITOR) 20 MG tablet Take 1 tablet (20 mg total) by mouth daily.   BIOTIN PO Take by mouth.   citalopram (CELEXA) 40 MG tablet Take 1 tablet (40 mg total) by mouth daily.   fluticasone (FLONASE) 50 MCG/ACT nasal spray PLACE 2 SPRAYS IN EACH NOSTRIL EVERY DAY   hydrochlorothiazide (HYDRODIURIL) 12.5 MG  tablet Take 1 tablet (12.5 mg total) by mouth daily.   Multiple Vitamins-Minerals (CENTRUM SILVER PO) Take by mouth.   naproxen sodium (ALEVE) 220 MG tablet Take 220 mg by mouth daily as needed.    Omega-3 Fatty Acids (FISH OIL PO) Take by mouth.   Potassium 99 MG TABS Take by mouth daily.    triamcinolone cream (KENALOG) 0.1 % Apply 1 application topically 2 (two) times daily.   No facility-administered medications prior to visit.    Review of Systems  All other systems reviewed and are negative.     Objective     BP 118/76 (BP Location: Left Arm, Patient Position: Sitting, Cuff Size: Normal)   Pulse 77   Temp 98.9 F (37.2 C) (Oral)   Wt 183 lb (83 kg)   SpO2 100%   BMI 31.41 kg/m     Physical Exam Vitals reviewed.  Constitutional:      General: She is not in acute distress.    Appearance: Normal appearance. She is well-developed. She is not diaphoretic.  HENT:     Head: Normocephalic and atraumatic.     Right Ear: Tympanic membrane, ear canal and external ear normal.     Left Ear: Tympanic membrane, ear canal and external ear normal.     Nose: Nose normal.     Mouth/Throat:     Mouth: Mucous membranes are moist.     Pharynx: Oropharynx is clear. No oropharyngeal exudate.  Eyes:     General: No scleral icterus.    Conjunctiva/sclera: Conjunctivae normal.     Pupils: Pupils are equal, round, and reactive to light.  Neck:     Thyroid: No thyromegaly.  Cardiovascular:     Rate and Rhythm: Normal rate and regular rhythm.     Pulses: Normal pulses.     Heart sounds: Normal heart sounds. No murmur heard. Pulmonary:     Effort: Pulmonary effort is normal. No respiratory distress.     Breath sounds: Normal breath sounds. No wheezing or rales.  Abdominal:     General: There is no distension.     Palpations: Abdomen is soft.     Tenderness: There is no abdominal tenderness.  Musculoskeletal:        General: No deformity.     Cervical back: Neck supple.     Right  lower leg: No edema.     Left lower leg: No edema.  Lymphadenopathy:     Cervical: No cervical adenopathy.  Skin:    General: Skin is warm and dry.  Neurological:     Mental Status: She is alert and oriented to person, place, and time. Mental status is at baseline.     Gait: Gait normal.  Psychiatric:        Mood and Affect: Mood normal.        Behavior: Behavior normal.        Thought Content: Thought content normal.       Last depression screening scores    12/26/2021    8:24 AM 06/26/2021    8:46 AM 10/04/2020    8:31 AM  PHQ 2/9 Scores  PHQ - 2 Score 1 0 0  PHQ- 9 Score 2 0 0   Last fall risk screening    12/26/2021    8:23 AM  Fall Risk   Falls in the past year? 0   Last Audit-C alcohol use screening    12/26/2021    8:23 AM  Alcohol Use Disorder Test (AUDIT)  1. How often do you have a drink containing alcohol? 2  2. How many drinks containing alcohol do you have on a typical day when you are drinking? 0  3. How often do you have six or more drinks on one occasion? 0  AUDIT-C Score 2   A score of 3 or more in women, and 4 or more in men indicates increased risk for alcohol abuse, EXCEPT if all of the points are from question 1   No results found for any visits on 12/26/21.  Assessment & Plan    Routine Health Maintenance and Physical Exam  Exercise Activities and Dietary recommendations  Goals   None     Immunization History  Administered Date(s) Administered   Influenza,inj,Quad PF,6+ Mos 03/13/2017, 04/02/2018, 03/29/2019   Influenza-Unspecified 03/27/2020, 04/12/2021   Moderna Covid-19 Vaccine Bivalent Booster 64yr & up 04/12/2021   Moderna Sars-Covid-2 Vaccination 10/05/2019, 11/02/2019, 05/18/2020   Tdap 09/01/2014   Zoster Recombinat (Shingrix) 11/18/2019, 02/03/2020   Zoster, Live 10/29/2011    Health Maintenance  Topic Date Due   COLONOSCOPY (Pts 45-464yrInsurance coverage will need to be confirmed)  12/29/2021   INFLUENZA VACCINE   02/10/2022   MAMMOGRAM  11/13/2023   TETANUS/TDAP  09/01/2024   PAP SMEAR-Modifier  09/26/2024   COVID-19 Vaccine  Completed   Hepatitis C Screening  Completed   HIV Screening  Completed   Zoster Vaccines- Shingrix  Completed   HPV VACCINES  Aged Out    Discussed health benefits of physical activity, and encouraged her to engage in regular exercise appropriate for her age and condition.  Problem List Items Addressed This Visit       Cardiovascular and Mediastinum   Hypertension    Well controlled Continue current medications Recheck metabolic panel F/u in 6 months       Relevant Orders   Comprehensive metabolic panel     Other   Hyperlipidemia    Previously well controlled Continue statin Repeat FLP and CMP      Relevant Orders   Comprehensive metabolic panel   Lipid panel   Prediabetes    Recommend low carb diet Recheck A1c       Relevant Orders   Hemoglobin A1c   Obesity    Discussed importance of healthy weight management Discussed diet and exercise  GAD (generalized anxiety disorder)    Chronic and well controlled Continue celexa      Episode of recurrent major depressive disorder ([place])    Chronic and well controlled Continue celexa      Other Visit Diagnoses     Encounter for annual physical exam    -  Primary   Relevant Orders   Comprehensive metabolic panel   Lipid panel   Hemoglobin A1c        Return in about 6 months (around 06/27/2022) for chronic disease f/u.     I, Lavon Paganini, MD, have reviewed all documentation for this visit. The documentation on 12/26/21 for the exam, diagnosis, procedures, and orders are all accurate and complete.   Sarya Linenberger, Dionne Bucy, MD, MPH [facility]

## 2021-12-26 ENCOUNTER — Encounter: Payer: Self-pay | Admitting: Family Medicine

## 2021-12-26 ENCOUNTER — Ambulatory Visit (INDEPENDENT_AMBULATORY_CARE_PROVIDER_SITE_OTHER): Payer: No Typology Code available for payment source | Admitting: Family Medicine

## 2021-12-26 VITALS — BP 118/76 | HR 77 | Temp 98.9°F | Wt 183.0 lb

## 2021-12-26 DIAGNOSIS — Z6831 Body mass index (BMI) 31.0-31.9, adult: Secondary | ICD-10-CM

## 2021-12-26 DIAGNOSIS — R7303 Prediabetes: Secondary | ICD-10-CM | POA: Diagnosis not present

## 2021-12-26 DIAGNOSIS — E7800 Pure hypercholesterolemia, unspecified: Secondary | ICD-10-CM

## 2021-12-26 DIAGNOSIS — Z0000 Encounter for general adult medical examination without abnormal findings: Secondary | ICD-10-CM | POA: Diagnosis not present

## 2021-12-26 DIAGNOSIS — I10 Essential (primary) hypertension: Secondary | ICD-10-CM | POA: Diagnosis not present

## 2021-12-26 DIAGNOSIS — E669 Obesity, unspecified: Secondary | ICD-10-CM

## 2021-12-26 DIAGNOSIS — F330 Major depressive disorder, recurrent, mild: Secondary | ICD-10-CM

## 2021-12-26 DIAGNOSIS — F411 Generalized anxiety disorder: Secondary | ICD-10-CM

## 2021-12-26 NOTE — Assessment & Plan Note (Signed)
Recommend low carb diet °Recheck A1c  °

## 2021-12-26 NOTE — Assessment & Plan Note (Signed)
Previously well controlled Continue statin Repeat FLP and CMP  

## 2021-12-26 NOTE — Assessment & Plan Note (Signed)
Discussed importance of healthy weight management Discussed diet and exercise  

## 2021-12-26 NOTE — Assessment & Plan Note (Signed)
Well controlled Continue current medications Recheck metabolic panel F/u in 6 months  

## 2021-12-26 NOTE — Assessment & Plan Note (Signed)
Chronic and well controlled Continue celexa

## 2021-12-27 LAB — COMPREHENSIVE METABOLIC PANEL
ALT: 19 IU/L (ref 0–32)
AST: 15 IU/L (ref 0–40)
Albumin/Globulin Ratio: 1.7 (ref 1.2–2.2)
Albumin: 4.2 g/dL (ref 3.8–4.9)
Alkaline Phosphatase: 92 IU/L (ref 44–121)
BUN/Creatinine Ratio: 29 — ABNORMAL HIGH (ref 9–23)
BUN: 20 mg/dL (ref 6–24)
Bilirubin Total: <0.2 mg/dL (ref 0.0–1.2)
CO2: 24 mmol/L (ref 20–29)
Calcium: 9.5 mg/dL (ref 8.7–10.2)
Chloride: 103 mmol/L (ref 96–106)
Creatinine, Ser: 0.69 mg/dL (ref 0.57–1.00)
Globulin, Total: 2.5 g/dL (ref 1.5–4.5)
Glucose: 86 mg/dL (ref 70–99)
Potassium: 4 mmol/L (ref 3.5–5.2)
Sodium: 141 mmol/L (ref 134–144)
Total Protein: 6.7 g/dL (ref 6.0–8.5)
eGFR: 100 mL/min/{1.73_m2} (ref >59)

## 2021-12-27 LAB — HEMOGLOBIN A1C
Est. average glucose Bld gHb Est-mCnc: 120 mg/dL
Hgb A1c MFr Bld: 5.8 % — ABNORMAL HIGH (ref 4.8–5.6)

## 2021-12-27 LAB — LIPID PANEL
Chol/HDL Ratio: 2.9 ratio (ref 0.0–4.4)
Cholesterol, Total: 146 mg/dL (ref 100–199)
HDL: 50 mg/dL (ref >39)
LDL Chol Calc (NIH): 79 mg/dL (ref 0–99)
Triglycerides: 91 mg/dL (ref 0–149)
VLDL Cholesterol Cal: 17 mg/dL (ref 5–40)

## 2022-01-08 ENCOUNTER — Encounter
Admission: RE | Disposition: A | Discharge status: Home / Self Care | Payer: Self-pay | Source: Home / Self Care | Attending: Gastroenterology

## 2022-01-08 ENCOUNTER — Ambulatory Visit: Payer: No Typology Code available for payment source | Admitting: Certified Registered"

## 2022-01-08 ENCOUNTER — Encounter: Payer: Self-pay | Admitting: Gastroenterology

## 2022-01-08 ENCOUNTER — Ambulatory Visit
Admission: RE | Admit: 2022-01-08 | Discharge: 2022-01-08 | Disposition: A | Discharge status: Home / Self Care | Payer: No Typology Code available for payment source | Attending: Gastroenterology | Admitting: Gastroenterology

## 2022-01-08 DIAGNOSIS — D128 Benign neoplasm of rectum: Secondary | ICD-10-CM | POA: Insufficient documentation

## 2022-01-08 DIAGNOSIS — Z87891 Personal history of nicotine dependence: Secondary | ICD-10-CM | POA: Insufficient documentation

## 2022-01-08 DIAGNOSIS — D122 Benign neoplasm of ascending colon: Secondary | ICD-10-CM | POA: Diagnosis not present

## 2022-01-08 DIAGNOSIS — Z1211 Encounter for screening for malignant neoplasm of colon: Secondary | ICD-10-CM | POA: Diagnosis present

## 2022-01-08 DIAGNOSIS — D126 Benign neoplasm of colon, unspecified: Secondary | ICD-10-CM | POA: Diagnosis not present

## 2022-01-08 DIAGNOSIS — I10 Essential (primary) hypertension: Secondary | ICD-10-CM | POA: Diagnosis not present

## 2022-01-08 DIAGNOSIS — D120 Benign neoplasm of cecum: Secondary | ICD-10-CM | POA: Diagnosis not present

## 2022-01-08 DIAGNOSIS — F32A Depression, unspecified: Secondary | ICD-10-CM | POA: Diagnosis not present

## 2022-01-08 DIAGNOSIS — Z86010 Personal history of colonic polyps: Secondary | ICD-10-CM | POA: Diagnosis not present

## 2022-01-08 DIAGNOSIS — K635 Polyp of colon: Secondary | ICD-10-CM | POA: Diagnosis not present

## 2022-01-08 HISTORY — PX: COLONOSCOPY WITH PROPOFOL: SHX5780

## 2022-01-08 SURGERY — COLONOSCOPY WITH PROPOFOL
Anesthesia: General

## 2022-01-08 MED ORDER — GLYCOPYRROLATE 0.2 MG/ML IJ SOLN
INTRAMUSCULAR | Status: DC | PRN
  Administered 2022-01-08: .2 mg via INTRAVENOUS

## 2022-01-08 MED ORDER — PROPOFOL 500 MG/50ML IV EMUL
INTRAVENOUS | Status: DC | PRN
  Administered 2022-01-08: 165 ug/kg/min via INTRAVENOUS

## 2022-01-08 MED ORDER — PROPOFOL 10 MG/ML IV BOLUS
INTRAVENOUS | Status: DC | PRN
  Administered 2022-01-08: 10 mg via INTRAVENOUS
  Administered 2022-01-08: 20 mg via INTRAVENOUS
  Administered 2022-01-08: 60 mg via INTRAVENOUS

## 2022-01-08 MED ORDER — LIDOCAINE HCL (CARDIAC) PF 100 MG/5ML IV SOSY
PREFILLED_SYRINGE | INTRAVENOUS | Status: DC | PRN
  Administered 2022-01-08: 100 mg via INTRAVENOUS

## 2022-01-08 MED ORDER — SODIUM CHLORIDE 0.9 % IV SOLN: INTRAVENOUS | Status: DC

## 2022-01-08 NOTE — Anesthesia Preprocedure Evaluation (Signed)
Anesthesia Evaluation  Patient identified by MRN, date of birth, ID band Patient awake    Reviewed: Allergy & Precautions, NPO status , Patient's Chart, lab work & pertinent test results  History of Anesthesia Complications Negative for: history of anesthetic complications  Airway Mallampati: II  TM Distance: >3 FB Neck ROM: Full    Dental no notable dental hx. (+) Teeth Intact   Pulmonary neg pulmonary ROS, neg sleep apnea, neg COPD, Patient abstained from smoking.Not current smoker, former smoker,    Pulmonary exam normal breath sounds clear to auscultation       Cardiovascular Exercise Tolerance: Good METShypertension, Pt. on medications (-) CAD and (-) Past MI (-) dysrhythmias  Rhythm:Regular Rate:Normal - Systolic murmurs    Neuro/Psych PSYCHIATRIC DISORDERS Anxiety Depression negative neurological ROS     GI/Hepatic neg GERD  ,(+)     (-) substance abuse  ,   Endo/Other  neg diabetes  Renal/GU negative Renal ROS     Musculoskeletal   Abdominal   Peds  Hematology   Anesthesia Other Findings Past Medical History: No date: Cancer ([place])     Comment:  skin No date: Cataract No date: Depression No date: Hyperlipidemia No date: Hypertension No date: Prediabetes No date: Recurrent sinusitis  Reproductive/Obstetrics                             Anesthesia Physical Anesthesia Plan  ASA: 2  Anesthesia Plan: General   Post-op Pain Management: Minimal or no pain anticipated   Induction: Intravenous  PONV Risk Score and Plan: 3 and Propofol infusion, TIVA and Ondansetron  Airway Management Planned: Nasal Cannula  Additional Equipment: None  Intra-op Plan:   Post-operative Plan:   Informed Consent: I have reviewed the patients History and Physical, chart, labs and discussed the procedure including the risks, benefits and alternatives for the proposed anesthesia with the  patient or authorized representative who has indicated his/her understanding and acceptance.     Dental advisory given  Plan Discussed with: CRNA and Surgeon  Anesthesia Plan Comments: (Discussed risks of anesthesia with patient, including possibility of difficulty with spontaneous ventilation under anesthesia necessitating airway intervention, PONV, and rare risks such as cardiac or respiratory or neurological events, and allergic reactions. Discussed the role of CRNA in patient's perioperative care. Patient understands.)        Anesthesia Quick Evaluation

## 2022-01-08 NOTE — Anesthesia Procedure Notes (Signed)
Procedure Name: General with mask airway Date/Time: 01/08/2022 9:25 AM  Performed by: Kelton Pillar, CRNAPre-anesthesia Checklist: Patient identified, Emergency Drugs available, Suction available and Patient being monitored Patient Re-evaluated:Patient Re-evaluated prior to induction Oxygen Delivery Method: Simple face mask Induction Type: IV induction Placement Confirmation: positive ETCO2, CO2 detector and breath sounds checked- equal and bilateral Dental Injury: Teeth and Oropharynx as per pre-operative assessment

## 2022-01-08 NOTE — Transfer of Care (Signed)
Immediate Anesthesia Transfer of Care Note  Patient: Janice Powers  Procedure(s) Performed: COLONOSCOPY WITH PROPOFOL  Patient Location: Endoscopy Unit  Anesthesia Type:General  Level of Consciousness: drowsy and patient cooperative  Airway & Oxygen Therapy: Patient Spontanous Breathing and Patient connected to face mask oxygen  Post-op Assessment: Report given to RN and Post -op Vital signs reviewed and stable  Post vital signs: Reviewed and stable  Last Vitals:  Vitals Value Taken Time  BP 116/75 01/08/22 1025  Temp 35.6 C 01/08/22 1025  Pulse 66 01/08/22 1027  Resp 20 01/08/22 1028  SpO2 100 % 01/08/22 1027  Vitals shown include unvalidated device data.  Last Pain:  Vitals:   01/08/22 1025  TempSrc: Temporal  PainSc: Asleep         Complications: No notable events documented.

## 2022-01-08 NOTE — H&P (Signed)
Cephas Darby, MD [address]  [place]  [place], [place] [address]  Main: [phone]  Fax: [phone] Pager: [phone]  Primary Care Physician:  Virginia Crews, MD Primary Gastroenterologist:  Dr. Cephas Darby  Pre-Procedure History & Physical: HPI:  Janice Powers is a 61 y.o. female is here for an colonoscopy.   Past Medical History:  Diagnosis Date   Cancer ([place])    skin   Cataract    Depression    Hyperlipidemia    Hypertension    Prediabetes    Recurrent sinusitis     Past Surgical History:  Procedure Laterality Date   ABDOMINAL HYSTERECTOMY     supracervical   EYE SURGERY Bilateral    lasik    Prior to Admission medications   Medication Sig Start Date End Date Taking? Authorizing Provider  atorvastatin (LIPITOR) 20 MG tablet Take 1 tablet (20 mg total) by mouth daily. 06/26/21  Yes Bacigalupo, Dionne Bucy, MD  citalopram (CELEXA) 40 MG tablet Take 1 tablet (40 mg total) by mouth daily. 06/26/21  Yes Bacigalupo, Dionne Bucy, MD  fluticasone (FLONASE) 50 MCG/ACT nasal spray PLACE 2 SPRAYS IN EACH NOSTRIL EVERY DAY 05/24/20  Yes Bacigalupo, Dionne Bucy, MD  BIOTIN PO Take by mouth.    [provider]  hydrochlorothiazide (HYDRODIURIL) 12.5 MG tablet Take 1 tablet (12.5 mg total) by mouth daily. 06/26/21   Virginia Crews, MD  Multiple Vitamins-Minerals (CENTRUM SILVER PO) Take by mouth.    [provider]  naproxen sodium (ALEVE) 220 MG tablet Take 220 mg by mouth daily as needed.     [provider]  Omega-3 Fatty Acids (FISH OIL PO) Take by mouth.    [provider]  Potassium 99 MG TABS Take by mouth daily.     [provider]  triamcinolone cream (KENALOG) 0.1 % Apply 1 application topically 2 (two) times daily. 07/15/21   Virginia Crews, MD    Allergies as of 12/17/2021 - Review Complete 12/17/2021  Allergen Reaction Noted   Neomycin Rash 08/06/2017   Penicillins Rash 08/06/2017   Sulfa  antibiotics Rash 08/06/2017    Family History  Problem Relation Age of Onset   Multiple myeloma Mother        mets to colon   Diabetes Father    COPD Father        smoker   Other Father        alpha 1 anti-trypsin deficiency   Skin cancer Father        non-melanoma   Congenital heart disease Brother    Diabetes Maternal Grandmother    Stroke Maternal Grandfather    Stroke Paternal Grandfather    Breast cancer Neg Hx    Ovarian cancer Neg Hx    Cervical cancer Neg Hx    Colon cancer Neg Hx     Social History   Socioeconomic History   Marital status: Divorced    Spouse name: Not on file   Number of children: 0   Years of education: 16   Highest education level: Bachelor's degree (e.g., BA, AB, BS)  Occupational History   Occupation: Nurse, adult: [employer]  Tobacco Use   Smoking status: Former    Packs/day: 0.50    Years: 1.00    Total pack years: 0.50    Types: Cigarettes    Quit date: 07/12/1982    Years since quitting: 39.5   Smokeless tobacco: Never  Vaping Use  Vaping Use: Never used  Substance and Sexual Activity   Alcohol use: Yes    Alcohol/week: 4.0 standard drinks of alcohol    Types: 2 Shots of liquor, 2 Glasses of wine per week   Drug use: No   Sexual activity: Never  Other Topics Concern   Not on file  Social History Narrative   Not on file   Social Determinants of Health   Financial Resource Strain: Low Risk  (08/06/2017)   Overall Financial Resource Strain (CARDIA)    Difficulty of Paying Living Expenses: Not hard at all  Food Insecurity: No Food Insecurity (08/06/2017)   Hunger Vital Sign    Worried About Running Out of Food in the Last Year: Never true    Ran Out of Food in the Last Year: Never true  Transportation Needs: No Transportation Needs (08/06/2017)   PRAPARE - Hydrologist (Medical): No    Lack of Transportation (Non-Medical): No  Physical Activity: Sufficiently Active (08/06/2017)    Exercise Vital Sign    Days of Exercise per Week: 5 days    Minutes of Exercise per Session: 30 min  Stress: Not on file  Social Connections: Not on file  Intimate Partner Violence: Not on file    Review of Systems: See HPI, otherwise negative ROS  Physical Exam: BP (!) 142/89   Pulse 63   Temp 97.8 F (36.6 C) (Temporal)   Resp 18   Ht 5' 4"  (1.626 m)   Wt 83 kg   SpO2 99%   BMI 31.41 kg/m  General:   Alert,  pleasant and cooperative in NAD Head:  Normocephalic and atraumatic. Neck:  Supple; no masses or thyromegaly. Lungs:  Clear throughout to auscultation.    Heart:  Regular rate and rhythm. Abdomen:  Soft, nontender and nondistended. Normal bowel sounds, without guarding, and without rebound.   Neurologic:  Alert and  oriented x4;  grossly normal neurologically.  Impression/Plan: Janice Powers is here for an colonoscopy to be performed for h/o colon adenomas  Risks, benefits, limitations, and alternatives regarding  colonoscopy have been reviewed with the patient.  Questions have been answered.  All parties agreeable.   Sherri Sear, MD  01/08/2022, 9:40 AM

## 2022-01-08 NOTE — Anesthesia Postprocedure Evaluation (Signed)
Anesthesia Post Note  Patient: Janice Powers  Procedure(s) Performed: COLONOSCOPY WITH PROPOFOL  Patient location during evaluation: Endoscopy Anesthesia Type: General Level of consciousness: awake and alert Pain management: pain level controlled Vital Signs Assessment: post-procedure vital signs reviewed and stable Respiratory status: spontaneous breathing, nonlabored ventilation, respiratory function stable and patient connected to nasal cannula oxygen Cardiovascular status: blood pressure returned to baseline and stable Postop Assessment: no apparent nausea or vomiting Anesthetic complications: no   No notable events documented.   Last Vitals:  Vitals:   01/08/22 1035 01/08/22 1045  BP: (!) 114/59 (!) 116/103  Pulse: 66   Resp: (!) 23 17  Temp:    SpO2: 98%     Last Pain:  Vitals:   01/08/22 1035  TempSrc:   PainSc: 0-No pain                 Arita Miss

## 2022-01-08 NOTE — Op Note (Signed)
[facility] Gastroenterology Patient Name: Janice Powers Procedure Date: 01/08/2022 9:45 AM MRN: [id number] Account #: [id number] Date of Birth: [birth year] Admit Type: Outpatient Age: 61 Room: [facility] ENDO ROOM 4 Gender: Female Note Status: Finalized Instrument Name: Jasper Riling [id number] Procedure:             Colonoscopy Indications:           Surveillance: Personal history of adenomatous polyps                         on last colonoscopy 5 years ago Providers:             Lin Landsman MD, MD Referring MD:          Dionne Bucy. Bacigalupo (Referring MD) Medicines:             General Anesthesia Complications:         No immediate complications. Estimated blood loss: None. Procedure:             Pre-Anesthesia Assessment:                        - Prior to the procedure, a History and Physical was                         performed, and patient medications and allergies were                         reviewed. The patient is competent. The risks and                         benefits of the procedure and the sedation options and                         risks were discussed with the patient. All questions                         were answered and informed consent was obtained.                         Patient identification and proposed procedure were                         verified by the physician, the nurse, the                         anesthesiologist, the anesthetist and the technician                         in the pre-procedure area in the procedure room in the                         endoscopy suite. Mental Status Examination: alert and                         oriented. Airway Examination: normal oropharyngeal                         airway and neck mobility. Respiratory Examination:  clear to auscultation. CV Examination: normal.                         Prophylactic Antibiotics: The patient does not require                         prophylactic  antibiotics. Prior Anticoagulants: The                         patient has taken no previous anticoagulant or                         antiplatelet agents. ASA Grade Assessment: II - A                         patient with mild systemic disease. After reviewing                         the risks and benefits, the patient was deemed in                         satisfactory condition to undergo the procedure. The                         anesthesia plan was to use general anesthesia.                         Immediately prior to administration of medications,                         the patient was re-assessed for adequacy to receive                         sedatives. The heart rate, respiratory rate, oxygen                         saturations, blood pressure, adequacy of pulmonary                         ventilation, and response to care were monitored                         throughout the procedure. The physical status of the                         patient was re-assessed after the procedure.                        After obtaining informed consent, the colonoscope was                         passed under direct vision. Throughout the procedure,                         the patient's blood pressure, pulse, and oxygen                         saturations were monitored continuously. The  Colonoscope was introduced through the anus and                         advanced to the the cecum, identified by appendiceal                         orifice and ileocecal valve. The colonoscopy was                         performed without difficulty. The patient tolerated                         the procedure well. The quality of the bowel                         preparation was evaluated using the BBPS [facility] Bowel                         Preparation Scale) with scores of: Right Colon = 3,                         Transverse Colon = 3 and Left Colon = 3 (entire mucosa                          seen well with no residual staining, small fragments                         of stool or opaque liquid). The total BBPS score                         equals 9. Findings:      The perianal and digital rectal examinations were normal. Pertinent       negatives include normal sphincter tone and no palpable rectal lesions.      Six sessile polyps were found in the rectum 1, ascending colon 3 and       cecum 2. The polyps were 3 to 6 mm in size. These polyps were removed       with a cold snare. Resection and retrieval were complete. Estimated       blood loss: none.      The retroflexed view of the distal rectum and anal verge was normal and       showed no anal or rectal abnormalities. Impression:            - Six 3 to 6 mm polyps in the rectum, in the ascending                         colon and in the cecum, removed with a cold snare.                         Resected and retrieved.                        - The distal rectum and anal verge are normal on                         retroflexion view. Recommendation:        -  Discharge patient to home (with escort).                        - Resume previous diet today.                        - Continue present medications.                        - Await pathology results.                        - Repeat colonoscopy in 3 - 5 years for surveillance                         based on pathology results. Procedure Code(s):     --- Professional ---                        [id number], Colonoscopy, flexible; with removal of                         tumor(s), polyp(s), or other lesion(s) by snare                         technique Diagnosis Code(s):     --- Professional ---                        Z86.010, Personal history of colonic polyps                        K62.1, Rectal polyp                        K63.5, Polyp of colon CPT copyright 2019 American Medical Association. All rights reserved. The codes documented in this report are preliminary and upon coder  review may  be revised to meet current compliance requirements. Dr. Ulyess Mort Lin Landsman MD, MD 01/08/2022 10:25:37 AM This report has been signed electronically. Number of Addenda: 0 Note Initiated On: 01/08/2022 9:45 AM Scope Withdrawal Time: 0 hours 16 minutes 13 seconds  Total Procedure Duration: 0 hours 21 minutes 49 seconds  Estimated Blood Loss:  Estimated blood loss: none.      [facility]

## 2022-01-09 ENCOUNTER — Encounter: Payer: Self-pay | Admitting: Gastroenterology

## 2022-01-09 LAB — SURGICAL PATHOLOGY

## 2022-03-18 ENCOUNTER — Encounter: Payer: Self-pay | Admitting: Family Medicine

## 2022-04-12 IMAGING — MG MM DIGITAL SCREENING BILAT W/ TOMO AND CAD
8 series · 8 of 24 positions shown · non-contrast
Comparison: Previous exam(s).

CLINICAL DATA: Screening.

EXAM:
DIGITAL SCREENING BILATERAL MAMMOGRAM WITH TOMOSYNTHESIS AND CAD
TECHNIQUE: Bilateral screening digital craniocaudal and mediolateral oblique
mammograms were obtained. Bilateral screening digital breast
tomosynthesis was performed. The images were evaluated with
computer-aided detection.

[L CC synth-2D]
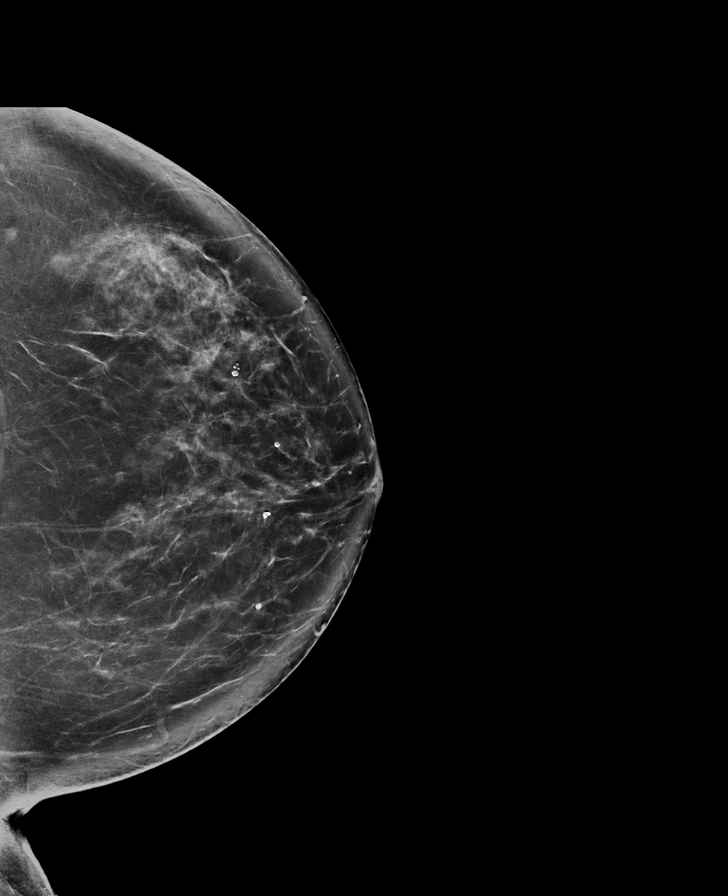

[L MLO synth-2D]
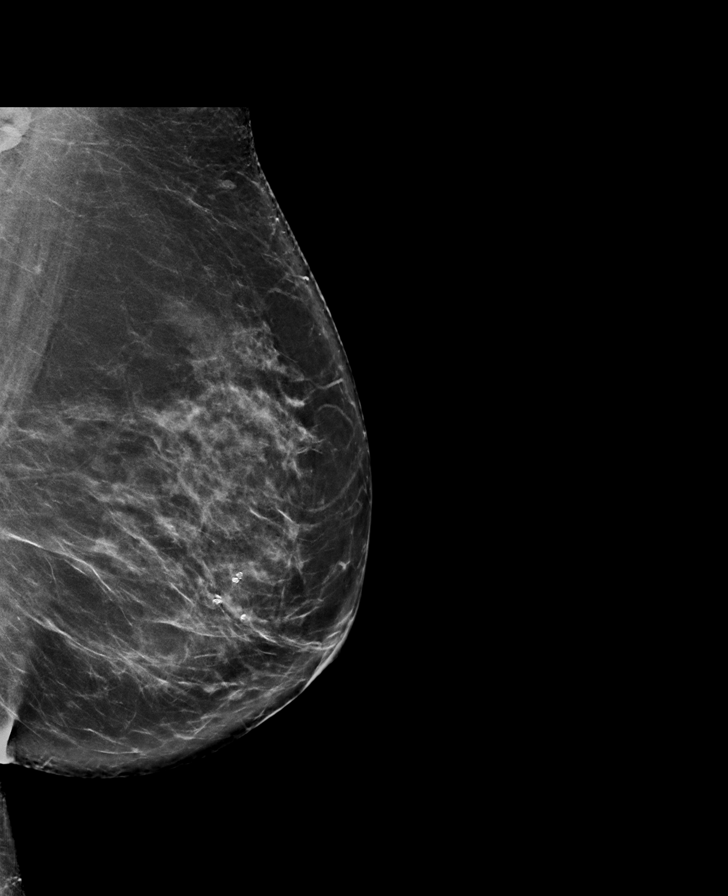

[R MLO synth-2D]
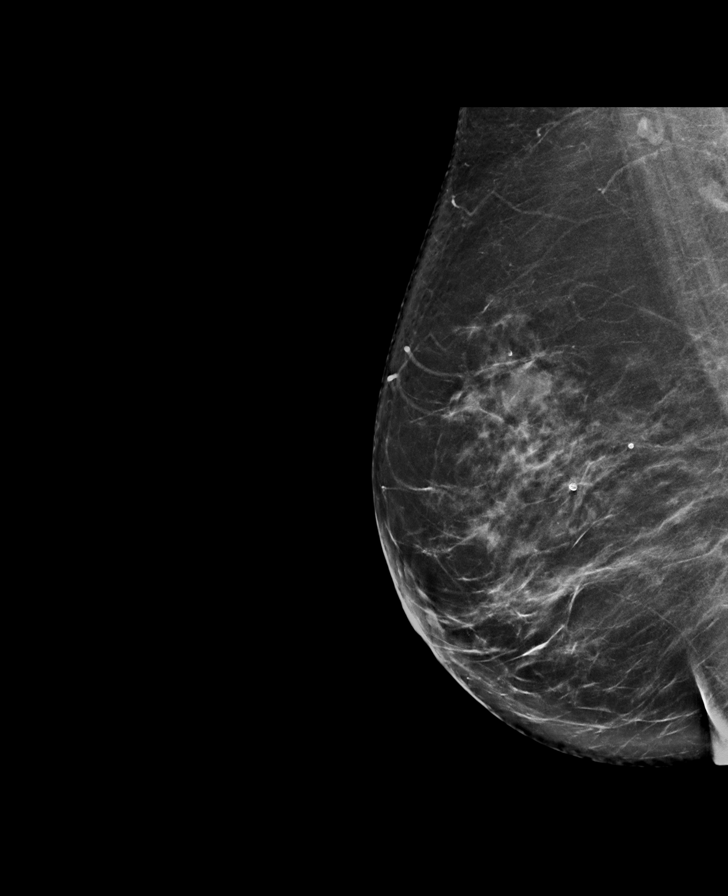

[R CC synth-2D]
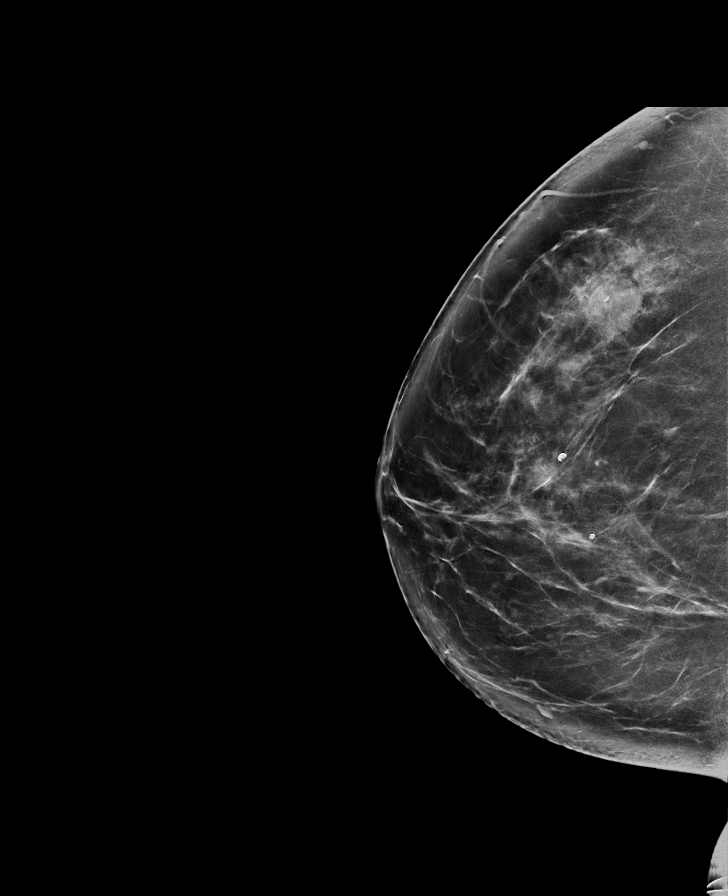

[L CC tomo · tomo slice 46/91.0]
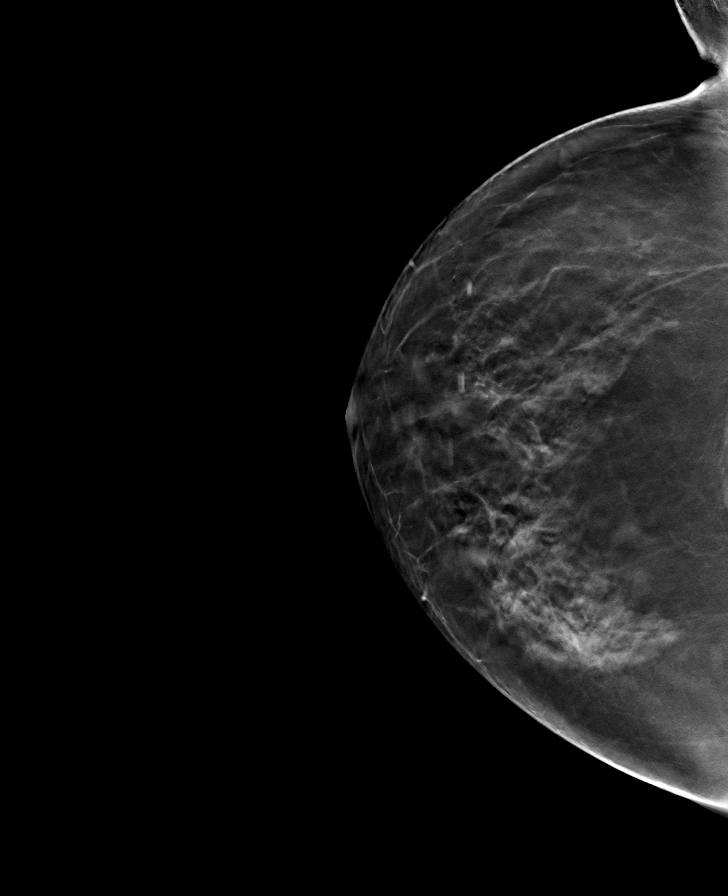

[R MLO tomo · tomo slice 47/92.0]
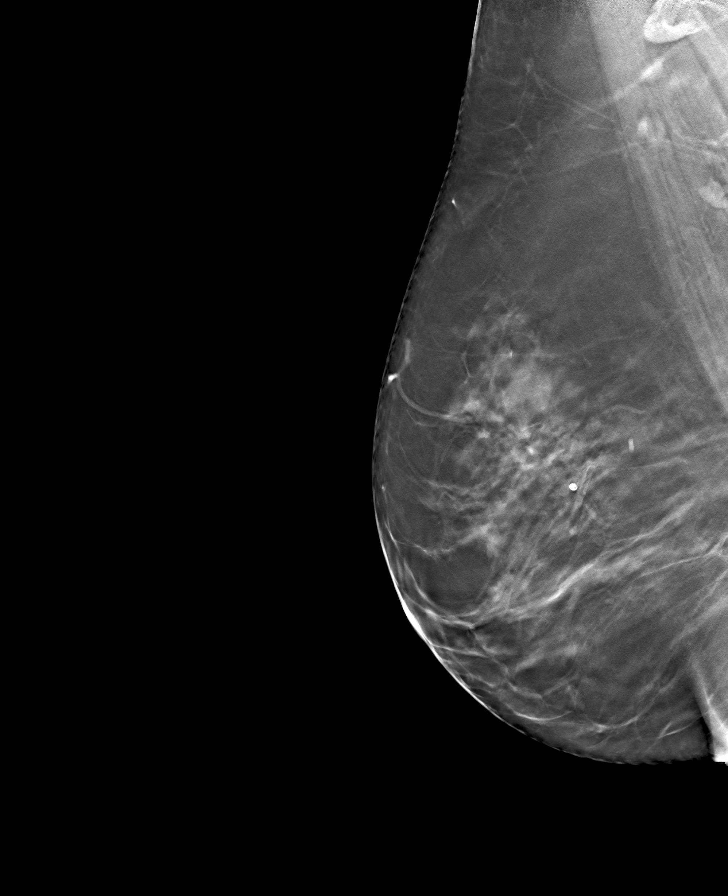

[R CC tomo · tomo slice 45/88.0]
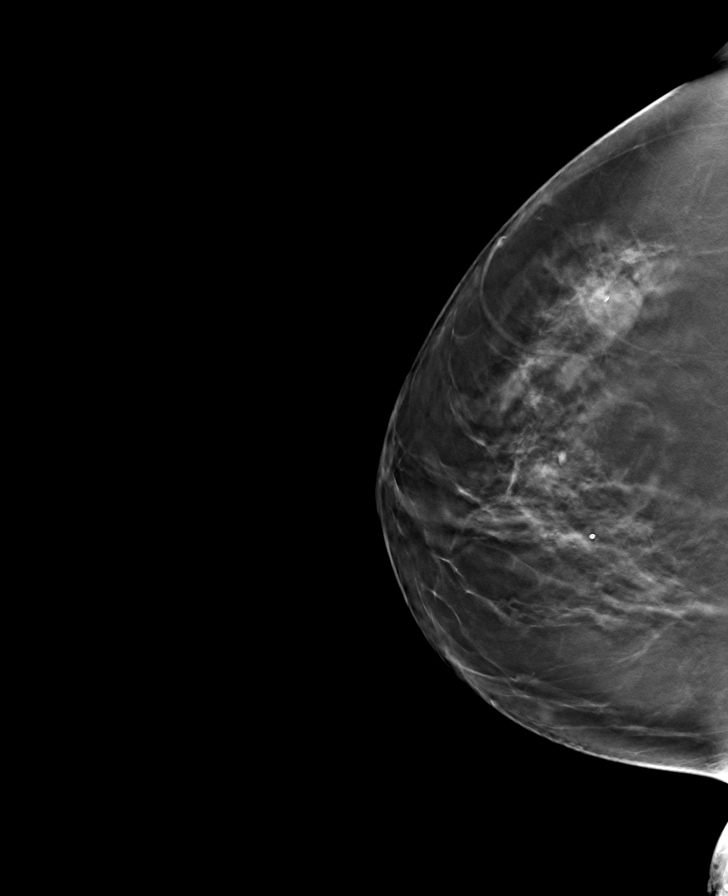

[L MLO tomo · tomo slice 47/92.0]
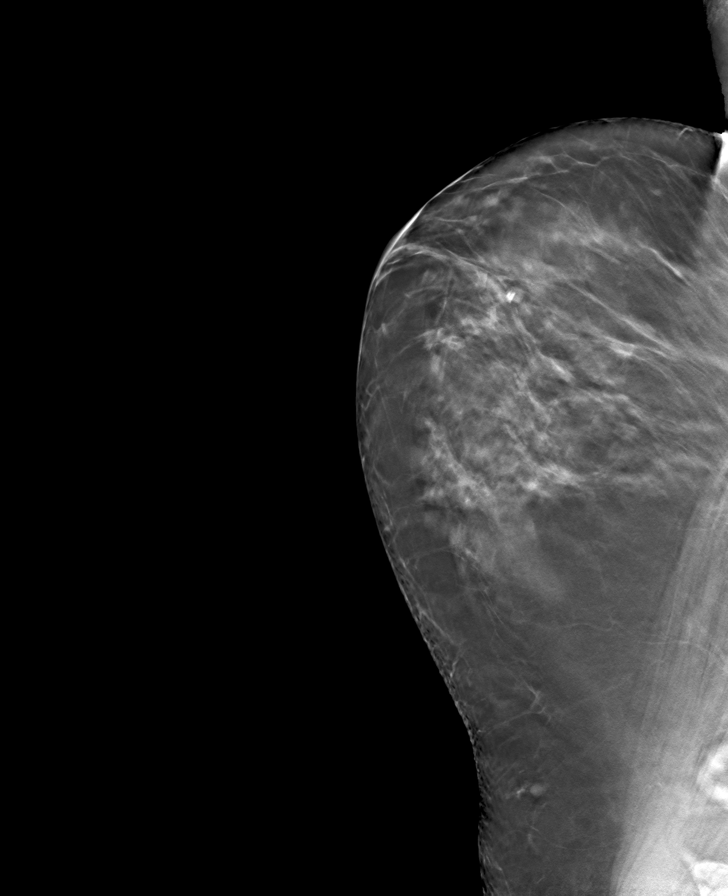

[8 of 24 positions shown; findings below may reference images not displayed]

ACR Breast Density Category c: The breast tissue is heterogeneously
dense, which may obscure small masses.
FINDINGS: There are no findings suspicious for malignancy. The images were
evaluated with computer-aided detection.
IMPRESSION: No mammographic evidence of malignancy. A result letter of this
screening mammogram will be mailed directly to the patient.

RECOMMENDATION:
Screening mammogram in one year. (Code:T4-5-GWO)

BI-RADS CATEGORY  1: Negative.

## 2022-06-29 NOTE — Progress Notes (Unsigned)
I,Alesi Zachery S Abra Lingenfelter,acting as a Education administrator for Lavon Paganini, MD.,have documented all relevant documentation on the behalf of Lavon Paganini, MD,as directed by  Lavon Paganini, MD while in the presence of Lavon Paganini, MD.     Established patient visit   Patient: Janice Powers   DOB: June 01, 1961   61 y.o. Female  MRN: [id number] Visit Date: 07/02/2022  Today's healthcare provider: Lavon Paganini, MD   Chief Complaint  Patient presents with   Hypertension   Hyperglycemia   Hyperlipidemia   Subjective    HPI  Prediabetes, Follow-up  Lab Results  Component Value Date   HGBA1C 5.8 (H) 12/26/2021   HGBA1C 5.7 (H) 06/26/2021   HGBA1C 5.7 (H) 10/04/2020   GLUCOSE 86 12/26/2021   GLUCOSE 86 06/26/2021   GLUCOSE 84 03/29/2020    Last seen for for this6 months ago.  Management since that visit includes no changes.  Pertinent Labs:    Component Value Date/Time   CHOL 146 12/26/2021 0855   TRIG 91 12/26/2021 0855   CHOLHDL 2.9 12/26/2021 0855   CREATININE 0.69 12/26/2021 0855    Wt Readings from Last 3 Encounters:  07/02/22 188 lb (85.3 kg)  01/08/22 183 lb (83 kg)  12/26/21 183 lb (83 kg)    -----------------------------------------------------------------------------------------  Hypertension, follow-up  BP Readings from Last 3 Encounters:  07/02/22 (!) 140/90  01/08/22 (!) 116/103  12/26/21 118/76   Wt Readings from Last 3 Encounters:  07/02/22 188 lb (85.3 kg)  01/08/22 183 lb (83 kg)  12/26/21 183 lb (83 kg)     She was last seen for hypertension 6 months ago.  BP at that visit was 118/76. Management since that visit includes no changes. She reports excellent compliance with treatment. She is not having side effects.  Outside blood pressures are elevated. --------------------------------------------------------------------------------------------------- Lipid/Cholesterol, follow-up  Last Lipid Panel: Lab Results  Component Value Date    CHOL 146 12/26/2021   LDLCALC 79 12/26/2021   HDL 50 12/26/2021   TRIG 91 12/26/2021    She was last seen for this 6 months ago.  Management since that visit includes no changes.  She reports excellent compliance with treatment. She is not having side effects.   Last metabolic panel Lab Results  Component Value Date   GLUCOSE 86 12/26/2021   NA 141 12/26/2021   K 4.0 12/26/2021   BUN 20 12/26/2021   CREATININE 0.69 12/26/2021   EGFR 100 12/26/2021   GFRNONAA 90 07/22/2020   CALCIUM 9.5 12/26/2021   AST 15 12/26/2021   ALT 19 12/26/2021   The 10-year ASCVD risk score (Arnett DK, et al., 2019) is: 9.2%  --------------------------------------------------------------------------------------------------- Grieving: Her dad passed away in 03/08/2023 and she started feeling depressed in October. Realizing this is her first christmas "as an orphan"  Medications: Outpatient Medications Prior to Visit  Medication Sig   BIOTIN PO Take by mouth.   Multiple Vitamins-Minerals (CENTRUM SILVER PO) Take by mouth.   naproxen sodium (ALEVE) 220 MG tablet Take 220 mg by mouth daily as needed.    Omega-3 Fatty Acids (FISH OIL PO) Take by mouth.   Potassium 99 MG TABS Take by mouth daily.    [DISCONTINUED] atorvastatin (LIPITOR) 20 MG tablet Take 1 tablet (20 mg total) by mouth daily.   [DISCONTINUED] citalopram (CELEXA) 40 MG tablet Take 1 tablet (40 mg total) by mouth daily.   [DISCONTINUED] fluticasone (FLONASE) 50 MCG/ACT nasal spray PLACE 2 SPRAYS IN EACH NOSTRIL EVERY DAY   [DISCONTINUED] hydrochlorothiazide (HYDRODIURIL)  12.5 MG tablet Take 1 tablet (12.5 mg total) by mouth daily.   [DISCONTINUED] triamcinolone cream (KENALOG) 0.1 % Apply 1 application topically 2 (two) times daily.   No facility-administered medications prior to visit.    Review of Systems per HPI     Objective    BP (!) 140/90 Comment: home reading  Pulse (!) 196   Temp 98.8 F (37.1 C) (Oral)   Resp 16   Wt  188 lb (85.3 kg)   BMI 32.27 kg/m  BP Readings from Last 3 Encounters:  07/02/22 (!) 140/90  01/08/22 (!) 116/103  12/26/21 118/76   Wt Readings from Last 3 Encounters:  07/02/22 188 lb (85.3 kg)  01/08/22 183 lb (83 kg)  12/26/21 183 lb (83 kg)      Physical Exam Vitals reviewed.  Constitutional:      General: She is not in acute distress.    Appearance: Normal appearance. She is well-developed. She is not diaphoretic.  HENT:     Head: Normocephalic and atraumatic.  Eyes:     General: No scleral icterus.    Conjunctiva/sclera: Conjunctivae normal.  Neck:     Thyroid: No thyromegaly.  Cardiovascular:     Rate and Rhythm: Normal rate and regular rhythm.     Pulses: Normal pulses.     Heart sounds: Normal heart sounds. No murmur heard. Pulmonary:     Effort: Pulmonary effort is normal. No respiratory distress.     Breath sounds: Normal breath sounds. No wheezing, rhonchi or rales.  Musculoskeletal:     Cervical back: Neck supple.     Right lower leg: No edema.     Left lower leg: No edema.  Lymphadenopathy:     Cervical: No cervical adenopathy.  Skin:    General: Skin is warm and dry.     Findings: No rash.  Neurological:     Mental Status: She is alert and oriented to person, place, and time. Mental status is at baseline.  Psychiatric:        Mood and Affect: Mood normal.        Behavior: Behavior normal.       No results found for any visits on 07/02/22.  Assessment & Plan     Problem List Items Addressed This Visit       Cardiovascular and Mediastinum   Hypertension - Primary    Elevated here and at home  Will increase HCTZ to 25 mg daily Recheck metabolic panel      Relevant Medications   hydrochlorothiazide (HYDRODIURIL) 25 MG tablet   atorvastatin (LIPITOR) 20 MG tablet   Other Relevant Orders   Comprehensive metabolic panel     Other   Hyperlipidemia    Previously well controlled Continue statin Repeat FLP and CMP      Relevant  Medications   hydrochlorothiazide (HYDRODIURIL) 25 MG tablet   atorvastatin (LIPITOR) 20 MG tablet   Other Relevant Orders   Comprehensive metabolic panel   Lipid panel   Prediabetes    Recommend low carb diet Recheck A1c       Relevant Orders   Hemoglobin A1c   Obesity    Discussed importance of healthy weight management Discussed diet and exercise       GAD (generalized anxiety disorder)    Chronic and well controlled Continue celexa at current dose      Relevant Medications   citalopram (CELEXA) 40 MG tablet   Grief    Due to passing of her  father She has a good support group and is using coping techniques No changes to medication        Return in about 4 weeks (around 07/30/2022) for BP f/u.      I, Lavon Paganini, MD, have reviewed all documentation for this visit. The documentation on 07/02/22 for the exam, diagnosis, procedures, and orders are all accurate and complete.   Bacigalupo, Dionne Bucy, MD, MPH [facility]

## 2022-07-02 ENCOUNTER — Encounter: Payer: Self-pay | Admitting: Family Medicine

## 2022-07-02 ENCOUNTER — Ambulatory Visit (INDEPENDENT_AMBULATORY_CARE_PROVIDER_SITE_OTHER): Payer: No Typology Code available for payment source | Admitting: Family Medicine

## 2022-07-02 VITALS — BP 140/90 | HR 196 | Temp 98.8°F | Resp 16 | Wt 188.0 lb

## 2022-07-02 DIAGNOSIS — F4321 Adjustment disorder with depressed mood: Secondary | ICD-10-CM

## 2022-07-02 DIAGNOSIS — F411 Generalized anxiety disorder: Secondary | ICD-10-CM

## 2022-07-02 DIAGNOSIS — E7800 Pure hypercholesterolemia, unspecified: Secondary | ICD-10-CM | POA: Diagnosis not present

## 2022-07-02 DIAGNOSIS — Z6832 Body mass index (BMI) 32.0-32.9, adult: Secondary | ICD-10-CM

## 2022-07-02 DIAGNOSIS — R7303 Prediabetes: Secondary | ICD-10-CM

## 2022-07-02 DIAGNOSIS — E66811 Obesity, class 1: Secondary | ICD-10-CM

## 2022-07-02 DIAGNOSIS — I10 Essential (primary) hypertension: Secondary | ICD-10-CM

## 2022-07-02 DIAGNOSIS — E669 Obesity, unspecified: Secondary | ICD-10-CM

## 2022-07-02 MED ORDER — HYDROCHLOROTHIAZIDE 25 MG PO TABS: 25.0000 mg | ORAL_TABLET | Freq: Every day | ORAL | 1 refills | Status: DC

## 2022-07-02 MED ORDER — FLUTICASONE PROPIONATE 50 MCG/ACT NA SUSP: 2.0000 | Freq: Every day | NASAL | 3 refills | Status: AC

## 2022-07-02 MED ORDER — CITALOPRAM HYDROBROMIDE 40 MG PO TABS: 40.0000 mg | ORAL_TABLET | Freq: Every day | ORAL | 3 refills | Status: AC

## 2022-07-02 MED ORDER — ATORVASTATIN CALCIUM 20 MG PO TABS
20.0000 mg | ORAL_TABLET | Freq: Every day | ORAL | 3 refills | Status: AC
Start: 2022-07-02 — End: ?

## 2022-07-02 NOTE — Assessment & Plan Note (Signed)
Chronic and well controlled Continue celexa at current dose

## 2022-07-02 NOTE — Assessment & Plan Note (Signed)
Discussed importance of healthy weight management Discussed diet and exercise  

## 2022-07-02 NOTE — Assessment & Plan Note (Signed)
Elevated here and at home  Will increase HCTZ to 25 mg daily Recheck metabolic panel

## 2022-07-02 NOTE — Assessment & Plan Note (Signed)
Due to passing of her father She has a good support group and is using coping techniques No changes to medication

## 2022-07-02 NOTE — Assessment & Plan Note (Signed)
Recommend low carb diet °Recheck A1c  °

## 2022-07-02 NOTE — Assessment & Plan Note (Signed)
Previously well controlled Continue statin Repeat FLP and CMP  

## 2022-07-03 LAB — COMPREHENSIVE METABOLIC PANEL
ALT: 25 IU/L (ref 0–32)
AST: 19 IU/L (ref 0–40)
Albumin/Globulin Ratio: 1.9 (ref 1.2–2.2)
Albumin: 4.5 g/dL (ref 3.8–4.9)
Alkaline Phosphatase: 97 IU/L (ref 44–121)
BUN/Creatinine Ratio: 35 — ABNORMAL HIGH (ref 12–28)
BUN: 23 mg/dL (ref 8–27)
Bilirubin Total: 0.3 mg/dL (ref 0.0–1.2)
CO2: 26 mmol/L (ref 20–29)
Calcium: 9.9 mg/dL (ref 8.7–10.3)
Chloride: 103 mmol/L (ref 96–106)
Creatinine, Ser: 0.65 mg/dL (ref 0.57–1.00)
Globulin, Total: 2.4 g/dL (ref 1.5–4.5)
Glucose: 84 mg/dL (ref 70–99)
Potassium: 4.1 mmol/L (ref 3.5–5.2)
Sodium: 142 mmol/L (ref 134–144)
Total Protein: 6.9 g/dL (ref 6.0–8.5)
eGFR: 101 mL/min/{1.73_m2} (ref >59)

## 2022-07-03 LAB — LIPID PANEL
Chol/HDL Ratio: 3 ratio (ref 0.0–4.4)
Cholesterol, Total: 166 mg/dL (ref 100–199)
HDL: 55 mg/dL (ref >39)
LDL Chol Calc (NIH): 92 mg/dL (ref 0–99)
Triglycerides: 103 mg/dL (ref 0–149)
VLDL Cholesterol Cal: 19 mg/dL (ref 5–40)

## 2022-07-03 LAB — HEMOGLOBIN A1C
Est. average glucose Bld gHb Est-mCnc: 120 mg/dL
Hgb A1c MFr Bld: 5.8 % — ABNORMAL HIGH (ref 4.8–5.6)

## 2022-07-24 ENCOUNTER — Telehealth: Payer: No Typology Code available for payment source | Admitting: Nurse Practitioner

## 2022-07-24 DIAGNOSIS — M542 Cervicalgia: Secondary | ICD-10-CM

## 2022-07-24 MED ORDER — CYCLOBENZAPRINE HCL 10 MG PO TABS: 10.0000 mg | ORAL_TABLET | Freq: Three times a day (TID) | ORAL | 0 refills | Status: DC | PRN

## 2022-07-24 MED ORDER — NAPROXEN 500 MG PO TABS: 500.0000 mg | ORAL_TABLET | Freq: Two times a day (BID) | ORAL | 1 refills | Status: AC

## 2022-07-24 NOTE — Progress Notes (Signed)
Virtual Visit Consent   Janice Powers, you are scheduled for a virtual visit with a [facility] provider today. Just as with appointments in the office, your consent must be obtained to participate. Your consent will be active for this visit and any virtual visit you may have with one of our providers in the next 365 days. If you have a MyChart account, a copy of this consent can be sent to you electronically.  As this is a virtual visit, video technology does not allow for your provider to perform a traditional examination. This may limit your provider's ability to fully assess your condition. If your provider identifies any concerns that need to be evaluated in person or the need to arrange testing (such as labs, EKG, etc.), we will make arrangements to do so. Although advances in technology are sophisticated, we cannot ensure that it will always work on either your end or our end. If the connection with a video visit is poor, the visit may have to be switched to a telephone visit. With either a video or telephone visit, we are not always able to ensure that we have a secure connection.  By engaging in this virtual visit, you consent to the provision of healthcare and authorize for your insurance to be billed (if applicable) for the services provided during this visit. Depending on your insurance coverage, you may receive a charge related to this service.  I need to obtain your verbal consent now. Are you willing to proceed with your visit today? Janice Powers has provided verbal consent on 07/24/2022 for a virtual visit (video or telephone). Apolonio Schneiders, FNP  Date: 07/24/2022 5:30 PM  Virtual Visit via Video Note   I, Apolonio Schneiders, connected with  Janice Powers  ([id number], 12-Sep-1960) on 07/24/22 at  5:30 PM EST by a video-enabled telemedicine application and verified that I am speaking with the correct person using two identifiers.  Location: Patient: Virtual Visit Location Patient: Home Provider:  Virtual Visit Location Provider: Home Office   I discussed the limitations of evaluation and management by telemedicine and the availability of in person appointments. The patient expressed understanding and agreed to proceed.    History of Present Illness: Janice Powers is a 62 y.o. who identifies as a female who was assigned female at birth, and is being seen today for muscle spasms in her neck.  She has been experiencing neck pain up into her right ear.   Her father recently passed away and she has been dealing with a lot of things post loss/stress etc  She has had shoulder pains and neck pains related to stress in the past but not recently treated.   She has been using Naproxen with relief, recently ran out  No surgeries or injuries to shoulders or neck  She has used pain medication and muscle relaxer in the past   Denies any numbness or weakness in her arm  Pain is more to right side      Problems:  Patient Active Problem List   Diagnosis Date Noted   Grief 07/02/2022   Colon adenomas    H/O adenomatous polyp of colon    Polyp of colon    Palpitations 10/04/2020   Episode of recurrent major depressive disorder ([place]) 09/27/2019   GAD (generalized anxiety disorder) 03/29/2019   Muscle spasm 09/21/2018   Contact dermatitis of external ear 09/21/2018   Allergic rhinitis 09/21/2018   Dry eye 09/21/2018   Obesity 02/04/2018   Hypertension 08/06/2017  Hyperlipidemia 08/06/2017   Prediabetes 08/06/2017   Cataract 08/06/2017   History of nonmelanoma skin cancer 08/06/2017   Hearing loss 08/06/2017   Eczema 08/06/2017   Metatarsalgia of right foot 08/06/2017   Recurrent sinusitis     Allergies:  Allergies  Allergen Reactions   Neomycin Rash   Penicillins Rash   Sulfa Antibiotics Rash   Medications:  Current Outpatient Medications:    atorvastatin (LIPITOR) 20 MG tablet, Take 1 tablet (20 mg total) by mouth daily., Disp: 90 tablet, Rfl: 3   BIOTIN PO, Take by  mouth., Disp: , Rfl:    citalopram (CELEXA) 40 MG tablet, Take 1 tablet (40 mg total) by mouth daily., Disp: 90 tablet, Rfl: 3   fluticasone (FLONASE) 50 MCG/ACT nasal spray, Place 2 sprays into both nostrils daily., Disp: 48 g, Rfl: 3   hydrochlorothiazide (HYDRODIURIL) 25 MG tablet, Take 1 tablet (25 mg total) by mouth daily., Disp: 90 tablet, Rfl: 1   Multiple Vitamins-Minerals (CENTRUM SILVER PO), Take by mouth., Disp: , Rfl:    naproxen sodium (ALEVE) 220 MG tablet, Take 220 mg by mouth daily as needed. , Disp: , Rfl:    Omega-3 Fatty Acids (FISH OIL PO), Take by mouth., Disp: , Rfl:    Potassium 99 MG TABS, Take by mouth daily. , Disp: , Rfl:   Observations/Objective: Patient is well-developed, well-nourished in no acute distress.  Resting comfortably  at home.  Head is normocephalic, atraumatic.  No labored breathing.  Speech is clear and coherent with logical content.  Patient is alert and oriented at baseline.    Assessment and Plan: 1. Neck pain on right side Take Naproxen with food only as needed Take Flexeril before bed/do not drive or operate machinery after taking  - naproxen (NAPROSYN) 500 MG tablet; Take 1 tablet (500 mg total) by mouth 2 (two) times daily with a meal.  Dispense: 30 tablet; Refill: 1 - cyclobenzaprine (FLEXERIL) 10 MG tablet; Take 1 tablet (10 mg total) by mouth 3 (three) times daily as needed for muscle spasms.  Dispense: 30 tablet; Refill: 0     Follow Up Instructions: I discussed the assessment and treatment plan with the patient. The patient was provided an opportunity to ask questions and all were answered. The patient agreed with the plan and demonstrated an understanding of the instructions.  A copy of instructions were sent to the patient via MyChart unless otherwise noted below.    The patient was advised to call back or seek an in-person evaluation if the symptoms worsen or if the condition fails to improve as anticipated.  Time:  I spent 10  minutes with the patient via telehealth technology discussing the above problems/concerns.    Apolonio Schneiders, FNP

## 2022-07-29 NOTE — Progress Notes (Signed)
I,Sulibeya S Dimas,acting as a Education administrator for Lavon Paganini, MD.,have documented all relevant documentation on the behalf of Lavon Paganini, MD,as directed by  Lavon Paganini, MD while in the presence of Lavon Paganini, MD.     Established patient visit   Patient: Janice Powers   DOB: 11/13/1960   62 y.o. Female  MRN: [id number] Visit Date: 07/30/2022  Today's healthcare provider: Lavon Paganini, MD   Chief Complaint  Patient presents with   Hypertension   Subjective    HPI  Follow up for hypertension  The patient was last seen for this 1 months ago. Changes made at last visit include increase HCTZ to 25 mg daily.  She reports excellent compliance with treatment. She feels that condition is Improved. Bp at home 130s/80s She is not having side effects.   BP Readings from Last 3 Encounters:  07/30/22 134/85  07/02/22 (!) 140/90  01/08/22 (!) 116/103     -----------------------------------------------------------------------------------------   Medications: Outpatient Medications Prior to Visit  Medication Sig   atorvastatin (LIPITOR) 20 MG tablet Take 1 tablet (20 mg total) by mouth daily.   BIOTIN PO Take by mouth.   citalopram (CELEXA) 40 MG tablet Take 1 tablet (40 mg total) by mouth daily.   cyclobenzaprine (FLEXERIL) 10 MG tablet Take 1 tablet (10 mg total) by mouth 3 (three) times daily as needed for muscle spasms.   fluticasone (FLONASE) 50 MCG/ACT nasal spray Place 2 sprays into both nostrils daily.   hydrochlorothiazide (HYDRODIURIL) 25 MG tablet Take 1 tablet (25 mg total) by mouth daily.   Multiple Vitamins-Minerals (CENTRUM SILVER PO) Take by mouth.   naproxen (NAPROSYN) 500 MG tablet Take 1 tablet (500 mg total) by mouth 2 (two) times daily with a meal.   naproxen sodium (ALEVE) 220 MG tablet Take 220 mg by mouth daily as needed.    Omega-3 Fatty Acids (FISH OIL PO) Take by mouth.   Potassium 99 MG TABS Take by mouth daily.    No  facility-administered medications prior to visit.    Review of Systems  Constitutional:  Negative for appetite change and fever.  Eyes:  Negative for visual disturbance.  Respiratory:  Negative for chest tightness and shortness of breath.   Cardiovascular:  Negative for chest pain and leg swelling.  Gastrointestinal:  Negative for abdominal pain, nausea and vomiting.  Neurological:  Negative for dizziness, light-headedness and headaches.       Objective    BP 134/85 (BP Location: Left Arm, Patient Position: Sitting, Cuff Size: Large)   Pulse 65   Temp 97.8 F (36.6 C) (Temporal)   Resp 16   Ht '5\' 4"'$  (1.626 m)   Wt 190 lb (86.2 kg)   BMI 32.61 kg/m  BP Readings from Last 3 Encounters:  07/30/22 134/85  07/02/22 (!) 140/90  01/08/22 (!) 116/103   Wt Readings from Last 3 Encounters:  07/30/22 190 lb (86.2 kg)  07/02/22 188 lb (85.3 kg)  01/08/22 183 lb (83 kg)      Physical Exam Vitals reviewed.  Constitutional:      General: She is not in acute distress.    Appearance: Normal appearance. She is well-developed. She is not diaphoretic.  HENT:     Head: Normocephalic and atraumatic.  Eyes:     General: No scleral icterus.    Conjunctiva/sclera: Conjunctivae normal.  Neck:     Thyroid: No thyromegaly.  Cardiovascular:     Rate and Rhythm: Normal rate and regular rhythm.  Heart sounds: Normal heart sounds. No murmur heard. Pulmonary:     Effort: Pulmonary effort is normal. No respiratory distress.     Breath sounds: Normal breath sounds. No wheezing, rhonchi or rales.  Musculoskeletal:     Cervical back: Neck supple.     Right lower leg: No edema.     Left lower leg: No edema.  Lymphadenopathy:     Cervical: No cervical adenopathy.  Skin:    General: Skin is warm and dry.     Findings: No rash.  Neurological:     Mental Status: She is alert and oriented to person, place, and time. Mental status is at baseline.  Psychiatric:        Mood and Affect: Mood  normal.        Behavior: Behavior normal.       No results found for any visits on 07/30/22.  Assessment & Plan     Problem List Items Addressed This Visit       Cardiovascular and Mediastinum   Hypertension - Primary    Well controlled Continue current medications Recviewed metabolic panel F/u in 6 months        Planning to move back home in May   Return in about 5 months (around 12/29/2022) for CPE.      I, Lavon Paganini, MD, have reviewed all documentation for this visit. The documentation on 07/30/22 for the exam, diagnosis, procedures, and orders are all accurate and complete.   Janthony Holleman, Dionne Bucy, MD, MPH [facility]

## 2022-07-30 ENCOUNTER — Encounter: Payer: Self-pay | Admitting: Family Medicine

## 2022-07-30 ENCOUNTER — Ambulatory Visit (INDEPENDENT_AMBULATORY_CARE_PROVIDER_SITE_OTHER): Payer: No Typology Code available for payment source | Admitting: Family Medicine

## 2022-07-30 VITALS — BP 134/85 | HR 65 | Temp 97.8°F | Resp 16 | Ht 64.0 in | Wt 190.0 lb

## 2022-07-30 DIAGNOSIS — I10 Essential (primary) hypertension: Secondary | ICD-10-CM | POA: Diagnosis not present

## 2022-07-30 NOTE — Assessment & Plan Note (Signed)
Well controlled Continue current medications Recviewed metabolic panel F/u in 6 months

## 2022-08-13 ENCOUNTER — Encounter: Payer: Self-pay | Admitting: Family Medicine

## 2022-08-13 DIAGNOSIS — R7303 Prediabetes: Secondary | ICD-10-CM

## 2022-08-13 NOTE — Telephone Encounter (Signed)
Ok to send nutrition referral for patient

## 2022-08-14 NOTE — Telephone Encounter (Signed)
Referral placed.

## 2022-10-01 ENCOUNTER — Encounter: Payer: No Typology Code available for payment source | Attending: Family Medicine | Admitting: Dietician

## 2022-10-01 ENCOUNTER — Encounter: Payer: Self-pay | Admitting: Dietician

## 2022-10-01 VITALS — Ht 64.0 in | Wt 193.4 lb

## 2022-10-01 DIAGNOSIS — Z713 Dietary counseling and surveillance: Secondary | ICD-10-CM | POA: Insufficient documentation

## 2022-10-01 DIAGNOSIS — Z6833 Body mass index (BMI) 33.0-33.9, adult: Secondary | ICD-10-CM | POA: Insufficient documentation

## 2022-10-01 DIAGNOSIS — E669 Obesity, unspecified: Secondary | ICD-10-CM | POA: Insufficient documentation

## 2022-10-01 DIAGNOSIS — E66811 Obesity, class 1: Secondary | ICD-10-CM

## 2022-10-01 DIAGNOSIS — R7303 Prediabetes: Secondary | ICD-10-CM

## 2022-10-01 NOTE — Patient Instructions (Addendum)
Try some precooked frozen meats such as grilled fish or chicken for easy and healthy meal choice. Check labels and keep sodium under 500mg  most of the time.  Work on preparing one balanced meal daily, not worrying about other meals or snacks. After 1-3 weeks, swap one snack into healthier option. Then 2 healthy meals, and so on.  OK to use healthy/ "light" frozen meals if not extremely high in sodium. Try new idea for a meal to spur interest in healthy options

## 2022-10-01 NOTE — Progress Notes (Signed)
Medical Nutrition Therapy: Visit start time: 1330  end time: 1430  Assessment:   Referral Diagnosis: prediabetes Other medical history/ diagnoses: HTN, hyperlipidemia, depression Psychosocial issues/ stress concerns: high stress level due to significant life changes, now a bit more positive. Reports some symptoms of depression, under control at this time.  Medications, supplements: reconciled list in medical record   Preferred learning method:  Auditory Visual Hands-on    Current weight: 193.4lbs Height: 5'4" BMI: 33.2 Patient's personal weight goal: 150lbs  Progress and evaluation:  Patient reports she does generally keep portions in control; working on food choices and balanced meals is more of an issue. Does have family history of diabetes Participated in [place] weight loss program in the past, lost weight down to about 183 (from >200lbs), but since then has regained.  She has been following DASH diet more recently, until dealing with caring for ailing father, no settling estate and moving. She is finding it difficult to make herself prepare a balanced meal even though she knows what to do and has options on hand. Recent HbA1C: 5.8% (07/01/22) Food allergies: none Special diet practices: none Patient seeks help with implementing/ resuming healthy habits    Dietary Intake:  Usual eating pattern includes 3 meals and 2 snacks per day. Dining out frequency: 3 meals per week. Who plans meals/ buys groceries? self Who prepares meals? self  Breakfast: today leftover pizza; peanut butter whole grain bread and honey Snack: same as pm Lunch: smoothie with nonfat [place] yogurt, veg, and fruit, protein, water; peanut butter on bread with honey; usu quick grab n go meal Snack: protein bar; banana Supper: same as lunch; sometimes balanced, sometimes piecemeal Snack: none Beverages: water, coffee black with 1 tsp sugar 20oz daily, low or nonfat milk; decaf green tea either hot or cold; occ  soda; stopped juice  Physical activity: yoga 15 minutes 4x a week   Intervention:   Nutrition Care Education:   Basic nutrition: general nutrition guidelines    Weight control: benefits of DASH diet; keeping foods low in fat and sugar; emphasis on vegetables and fruits with lean proteins  Advanced nutrition:  cooking techniques ie pre-prepping meals, making use of frozen healthy options; making healthy choices convenient; trying new meal idea or recipe to spur interest/ motivation prediabetes:  effects of stress; controlling carb intake; balanced meal options Hypertension: identifying high sodium foods, goal for sodium intake   Other intervention notes: Patient has been following healthy eating pattern in the past, would like to resume that pattern. Established goals for manageable changes, with direction from patient. No follow up scheduled as patient will be moving away from the area. She will make contact if any questions develop or if she would like to schedule a virtual visit.   Nutritional Diagnosis:  [place]-2.2 Altered nutrition-related laboratory As related to prediabetes.  As evidenced by elevated HbA1C. [place]-3.3 Overweight/obesity As related to history of excess calories and inadequate physical activity, significant personal stress.  As evidenced by patient with current BMI of 33.   Education Materials given:  Museum/gallery conservator with food lists, sample meal pattern Sample menus 52 Proven Stress Reducers 10 Ways to Manage Stress Visit summary with goals/ instructions   Learner/ who was taught:  Patient   Level of understanding: Verbalizes/ demonstrates competency   Demonstrated degree of understanding via:   Teach back Learning barriers: None  Willingness to learn/ readiness for change: Acceptance, ready for change if done gradually, in manageable steps  Monitoring and Evaluation:  Dietary intake, exercise, BG control, and body weight      follow up: prn

## 2022-11-08 ENCOUNTER — Telehealth: Payer: No Typology Code available for payment source | Admitting: Urgent Care

## 2022-11-08 DIAGNOSIS — M62838 Other muscle spasm: Secondary | ICD-10-CM | POA: Diagnosis not present

## 2022-11-08 DIAGNOSIS — M542 Cervicalgia: Secondary | ICD-10-CM | POA: Diagnosis not present

## 2022-11-08 MED ORDER — CYCLOBENZAPRINE HCL 10 MG PO TABS: 10.0000 mg | ORAL_TABLET | Freq: Three times a day (TID) | ORAL | 2 refills | Status: AC | PRN

## 2022-11-08 NOTE — Progress Notes (Signed)
Virtual Visit Consent   Janice Powers, you are scheduled for a virtual visit with a [facility] provider today. Just as with appointments in the office, your consent must be obtained to participate. Your consent will be active for this visit and any virtual visit you may have with one of our providers in the next 365 days. If you have a MyChart account, a copy of this consent can be sent to you electronically.  As this is a virtual visit, video technology does not allow for your provider to perform a traditional examination. This may limit your provider's ability to fully assess your condition. If your provider identifies any concerns that need to be evaluated in person or the need to arrange testing (such as labs, EKG, etc.), we will make arrangements to do so. Although advances in technology are sophisticated, we cannot ensure that it will always work on either your end or our end. If the connection with a video visit is poor, the visit may have to be switched to a telephone visit. With either a video or telephone visit, we are not always able to ensure that we have a secure connection.  By engaging in this virtual visit, you consent to the provision of healthcare and authorize for your insurance to be billed (if applicable) for the services provided during this visit. Depending on your insurance coverage, you may receive a charge related to this service.  I need to obtain your verbal consent now. Are you willing to proceed with your visit today? Janice Powers has provided verbal consent on 11/08/2022 for a virtual visit (video or telephone). Maretta Bees, PA  Date: 11/08/2022 10:25 AM  Virtual Visit via Video Note   I, Mauricio Dahlen L Anara Cowman, connected with  Marissah Vandemark  ([id number], July 17, 1960) on 11/08/22 at 10:15 AM EDT by a video-enabled telemedicine application and verified that I am speaking with the correct person using two identifiers.  Location: Patient: Virtual Visit Location Patient:  Home Provider: Virtual Visit Location Provider: Home Office   I discussed the limitations of evaluation and management by telemedicine and the availability of in person appointments. The patient expressed understanding and agreed to proceed.    History of Present Illness: Janice Powers is a 62 y.o. who identifies as a female who was assigned female at birth, and is being seen today for muscle spasms.  HPI: 62yo female presents today with request of refill of cyclobenzaprine. Admits to a recurrent hx of neck spasms secondary to stress. Was seen in January 2024 for the same thing. Reports her current sx are on the left side of the neck, but they alternate depending on the day. Has had great success with a combo therapy of naproxen and flexeril in the past, states they do not work unless concurrently taken. Does have side effect of constipation on aleve if too high of a dose and thus will take OTC aleve. Denies sedation on flexeril. Admits this is not a new problem. Denies severe headache, photophobia, nuchal rigidity, fever or any additional neurological sx. Believes her sx will resolve when her stressors resolve in a week or two.    Problems:  Patient Active Problem List   Diagnosis Date Noted   Grief 07/02/2022   Colon adenomas    H/O adenomatous polyp of colon    Polyp of colon    Palpitations 10/04/2020   Episode of recurrent major depressive disorder ([facility]) 09/27/2019   GAD (generalized anxiety disorder) 03/29/2019   Muscle spasm 09/21/2018  Contact dermatitis of external ear 09/21/2018   Allergic rhinitis 09/21/2018   Dry eye 09/21/2018   Obesity 02/04/2018   Hypertension 08/06/2017   Hyperlipidemia 08/06/2017   Prediabetes 08/06/2017   Cataract 08/06/2017   History of nonmelanoma skin cancer 08/06/2017   Hearing loss 08/06/2017   Eczema 08/06/2017   Metatarsalgia of right foot 08/06/2017   Recurrent sinusitis     Allergies:  Allergies  Allergen Reactions   Neomycin Rash    Penicillins Rash   Sulfa Antibiotics Rash   Medications:  Current Outpatient Medications:    atorvastatin (LIPITOR) 20 MG tablet, Take 1 tablet (20 mg total) by mouth daily., Disp: 90 tablet, Rfl: 3   BIOTIN PO, Take by mouth., Disp: , Rfl:    citalopram (CELEXA) 40 MG tablet, Take 1 tablet (40 mg total) by mouth daily., Disp: 90 tablet, Rfl: 3   cyclobenzaprine (FLEXERIL) 10 MG tablet, Take 1 tablet (10 mg total) by mouth 3 (three) times daily as needed for muscle spasms., Disp: 30 tablet, Rfl: 2   fluticasone (FLONASE) 50 MCG/ACT nasal spray, Place 2 sprays into both nostrils daily., Disp: 48 g, Rfl: 3   hydrochlorothiazide (HYDRODIURIL) 25 MG tablet, Take 1 tablet (25 mg total) by mouth daily., Disp: 90 tablet, Rfl: 1   Multiple Vitamins-Minerals (CENTRUM SILVER PO), Take by mouth., Disp: , Rfl:    naproxen sodium (ALEVE) 220 MG tablet, Take 220 mg by mouth daily as needed. , Disp: , Rfl:    Omega-3 Fatty Acids (FISH OIL PO), Take by mouth., Disp: , Rfl:    Potassium 99 MG TABS, Take by mouth daily. , Disp: , Rfl:   Observations/Objective: Patient is well-developed, well-nourished in no acute distress.  Resting comfortably at home.  Head is normocephalic, atraumatic.  No labored breathing.  Speech is clear and coherent with logical content.  Patient is alert and oriented at baseline.  No visible neurological abnormalities.  Assessment and Plan: 1. Neck pain on right side - cyclobenzaprine (FLEXERIL) 10 MG tablet; Take 1 tablet (10 mg total) by mouth 3 (three) times daily as needed for muscle spasms.  Dispense: 30 tablet; Refill: 2  2. Muscle spasm  Pt requesting refill of flexeril. No ADRs. Will take OTC naproxen as well with food. Moist heat to neck.  Follow Up Instructions: I discussed the assessment and treatment plan with the patient. The patient was provided an opportunity to ask questions and all were answered. The patient agreed with the plan and demonstrated an  understanding of the instructions.  A copy of instructions were sent to the patient via MyChart unless otherwise noted below.   The patient was advised to call back or seek an in-person evaluation if the symptoms worsen or if the condition fails to improve as anticipated.  Time:  I spent 7 minutes with the patient via telehealth technology discussing the above problems/concerns.    Izadora Roehr L Zanayah Shadowens, PA

## 2022-11-08 NOTE — Patient Instructions (Signed)
Earnest Bailey, thank you for joining Maretta Bees, PA for today's virtual visit.  While this provider is not your primary care provider (PCP), if your PCP is located in our provider database this encounter information will be shared with them immediately following your visit.   A [facility] MyChart account gives you access to today's visit and all your visits, tests, and labs performed at [facility] " click here if you don't have a [facility] MyChart account or go to [email or web address]  Consent: (Patient) Janice Powers provided verbal consent for this virtual visit at the beginning of the encounter.  Current Medications:  Current Outpatient Medications:    atorvastatin (LIPITOR) 20 MG tablet, Take 1 tablet (20 mg total) by mouth daily., Disp: 90 tablet, Rfl: 3   BIOTIN PO, Take by mouth., Disp: , Rfl:    citalopram (CELEXA) 40 MG tablet, Take 1 tablet (40 mg total) by mouth daily., Disp: 90 tablet, Rfl: 3   cyclobenzaprine (FLEXERIL) 10 MG tablet, Take 1 tablet (10 mg total) by mouth 3 (three) times daily as needed for muscle spasms., Disp: 30 tablet, Rfl: 2   fluticasone (FLONASE) 50 MCG/ACT nasal spray, Place 2 sprays into both nostrils daily., Disp: 48 g, Rfl: 3   hydrochlorothiazide (HYDRODIURIL) 25 MG tablet, Take 1 tablet (25 mg total) by mouth daily., Disp: 90 tablet, Rfl: 1   Multiple Vitamins-Minerals (CENTRUM SILVER PO), Take by mouth., Disp: , Rfl:    naproxen sodium (ALEVE) 220 MG tablet, Take 220 mg by mouth daily as needed. , Disp: , Rfl:    Omega-3 Fatty Acids (FISH OIL PO), Take by mouth., Disp: , Rfl:    Potassium 99 MG TABS, Take by mouth daily. , Disp: , Rfl:    Medications ordered in this encounter:  Meds ordered this encounter  Medications   cyclobenzaprine (FLEXERIL) 10 MG tablet    Sig: Take 1 tablet (10 mg total) by mouth 3 (three) times daily as needed for muscle spasms.    Dispense:  30 tablet    Refill:  2    Order Specific Question:    Supervising Provider    Answer:   Merrilee Jansky [id number]     *If you need refills on other medications prior to your next appointment, please contact your pharmacy*  Follow-Up: Call back or seek an in-person evaluation if the symptoms worsen or if the condition fails to improve as anticipated.  [facility] [phone]  Other Instructions Take the cyclobenzaprine up to three times daily, as needed. Take with caution as it may make you drowsy. Do not drive a car or operate machinery after taking. You may also take Naproxen (Aleve) OTC twice daily with food. Apply a moist heat compress to the back of your neck. Consider massage. If you develop any new or worsening symptoms, please follow up with your PCP.   If you have been instructed to have an in-person evaluation today at a local Urgent Care facility, please use the link below. It will take you to a list of all of our available [facility], including address, phone number and hours of operation. Please do not delay care.  [facility]  If you or a family member do not have a primary care provider, use the link below to schedule a visit and establish care. When you choose a [facility] primary care physician or advanced practice provider, you gain a long-term partner in health. Find a  Primary Care Provider  Learn more about [facility] in-office and virtual care options: [facility]

## 2023-01-04 ENCOUNTER — Encounter: Payer: Self-pay | Admitting: Family Medicine

## 2023-01-04 ENCOUNTER — Other Ambulatory Visit: Payer: Self-pay

## 2023-01-05 MED ORDER — HYDROCHLOROTHIAZIDE 25 MG PO TABS: 25.0000 mg | ORAL_TABLET | Freq: Every day | ORAL | 0 refills | Status: AC
